# Patient Record
Sex: Female | Born: 1970 | Race: White | Hispanic: No | Marital: Married | Smoking: Never smoker
Health system: Southern US, Community
[De-identification: ages and names within clinical notes are randomized; demographics above are authoritative.]

---

## 2011-03-26 ENCOUNTER — Ambulatory Visit: Payer: Self-pay | Admitting: Nephrology

## 2012-04-04 LAB — HM PAP SMEAR

## 2012-06-30 ENCOUNTER — Ambulatory Visit: Payer: Self-pay

## 2012-06-30 LAB — HM MAMMOGRAPHY

## 2015-07-31 DIAGNOSIS — D509 Iron deficiency anemia, unspecified: Secondary | ICD-10-CM | POA: Insufficient documentation

## 2015-08-01 ENCOUNTER — Ambulatory Visit (INDEPENDENT_AMBULATORY_CARE_PROVIDER_SITE_OTHER): Payer: Managed Care, Other (non HMO) | Admitting: Unknown Physician Specialty

## 2015-08-01 ENCOUNTER — Encounter: Payer: Self-pay | Admitting: Unknown Physician Specialty

## 2015-08-01 VITALS — BP 110/82 | HR 76 | Temp 98.3°F | Ht 61.8 in | Wt 164.0 lb

## 2015-08-01 DIAGNOSIS — Z0000 Encounter for general adult medical examination without abnormal findings: Secondary | ICD-10-CM | POA: Diagnosis not present

## 2015-08-01 DIAGNOSIS — Z23 Encounter for immunization: Secondary | ICD-10-CM | POA: Diagnosis not present

## 2015-08-01 NOTE — Progress Notes (Signed)
+  BP 110/82 mmHg  Pulse 76  Temp(Src) 98.3 F (36.8 C)  Ht 5' 1.8" (1.57 m)  Wt 164 lb (74.39 kg)  BMI 30.18 kg/m2  SpO2 100%  LMP 07/22/2015 (Exact Date)   Subjective:    Patient ID: Mary Booker, female    DOB: 03/15/1971, 44 y.o.   MRN: [id number]  HPI: Mary Booker is a 44 y.o. female  Chief Complaint  Patient presents with  . Annual Exam    Relevant past medical, surgical, family and social history reviewed and updated as indicated. Interim medical history since our last visit reviewed. Allergies and medications reviewed and updated.  Depression screen PHQ 2/9 08/01/2015  Decreased Interest 0  Down, Depressed, Hopeless 0  PHQ - 2 Score 0     Review of Systems  Unable to perform ROS Constitutional: Negative.   HENT: Negative.   Eyes: Negative.   Respiratory: Negative.   Cardiovascular: Negative.   Gastrointestinal: Negative.   Endocrine: Negative.   Genitourinary: Negative.        Irregular menses, breast tenderness.    Musculoskeletal: Negative.   Skin: Negative.   Allergic/Immunologic: Negative.   Neurological: Negative.   Hematological: Negative.   Psychiatric/Behavioral: Negative.   All other systems reviewed and are negative.  Per HPI unless specifically indicated above     Objective:    BP 110/82 mmHg  Pulse 76  Temp(Src) 98.3 F (36.8 C)  Ht 5' 1.8" (1.57 m)  Wt 164 lb (74.39 kg)  BMI 30.18 kg/m2  SpO2 100%  LMP 07/22/2015 (Exact Date)  Wt Readings from Last 3 Encounters:  08/01/15 164 lb (74.39 kg)  02/21/15 161 lb (73.029 kg)    Physical Exam  Constitutional: She is oriented to person, place, and time. She appears well-developed and well-nourished.  HENT:  Head: Normocephalic and atraumatic.  Eyes: Pupils are equal, round, and reactive to light. Right eye exhibits no discharge. Left eye exhibits no discharge. No scleral icterus.  Neck: Normal range of motion. Neck supple. Carotid bruit is not present. No thyromegaly present.   Cardiovascular: Normal rate, regular rhythm and normal heart sounds.  Exam reveals no gallop and no friction rub.   No murmur heard. Pulmonary/Chest: Effort normal and breath sounds normal. No respiratory distress. She has no wheezes. She has no rales.  Abdominal: Soft. Bowel sounds are normal. There is no tenderness. There is no rebound.  Genitourinary: No breast swelling, tenderness or discharge.  Musculoskeletal: Normal range of motion.  Lymphadenopathy:    She has no cervical adenopathy.  Neurological: She is alert and oriented to person, place, and time.  Skin: Skin is warm, dry and intact. No rash noted.  Psychiatric: She has a normal mood and affect. Her speech is normal and behavior is normal. Judgment and thought content normal. Cognition and memory are normal.  Nursing note and vitals reviewed.     Assessment & Plan:   Problem List Items Addressed This Visit    None    Visit Diagnoses    Immunization due    -  Primary    Relevant Orders    Tdap vaccine greater than or equal to 7yo IM (Completed)    Annual physical exam        Relevant Orders    IGP, Aptima HPV, rfx 16/18,45    HIV antibody    CBC with Differential/Platelet    Comprehensive metabolic panel    Lipid Panel w/o Chol/HDL Ratio    TSH  Follow up plan: Return in about 1 year (around 07/31/2016).

## 2015-08-02 LAB — COMPREHENSIVE METABOLIC PANEL
A/G RATIO: 1.7 (ref 1.1–2.5)
ALK PHOS: 55 IU/L (ref 39–117)
ALT: 9 IU/L (ref 0–32)
AST: 10 IU/L (ref 0–40)
Albumin: 4.3 g/dL (ref 3.5–5.5)
BUN/Creatinine Ratio: 12 (ref 9–23)
BUN: 8 mg/dL (ref 6–24)
CHLORIDE: 101 mmol/L (ref 97–108)
CO2: 22 mmol/L (ref 18–29)
Calcium: 8.9 mg/dL (ref 8.7–10.2)
Creatinine, Ser: 0.69 mg/dL (ref 0.57–1.00)
GFR calc Af Amer: 122 mL/min/{1.73_m2} (ref >59)
GFR calc non Af Amer: 106 mL/min/{1.73_m2} (ref >59)
GLOBULIN, TOTAL: 2.5 g/dL (ref 1.5–4.5)
Glucose: 90 mg/dL (ref 65–99)
POTASSIUM: 4.6 mmol/L (ref 3.5–5.2)
SODIUM: 137 mmol/L (ref 134–144)
Total Protein: 6.8 g/dL (ref 6.0–8.5)

## 2015-08-02 LAB — CBC WITH DIFFERENTIAL/PLATELET
BASOS: 1 %
Basophils Absolute: 0 10*3/uL (ref 0.0–0.2)
EOS (ABSOLUTE): 0.2 10*3/uL (ref 0.0–0.4)
Eos: 3 %
HEMATOCRIT: 31 % — AB (ref 34.0–46.6)
Hemoglobin: 9.7 g/dL — ABNORMAL LOW (ref 11.1–15.9)
IMMATURE GRANULOCYTES: 1 %
Immature Grans (Abs): 0 10*3/uL (ref 0.0–0.1)
LYMPHS ABS: 1.4 10*3/uL (ref 0.7–3.1)
Lymphs: 28 %
MCH: 22.5 pg — AB (ref 26.6–33.0)
MCHC: 31.3 g/dL — AB (ref 31.5–35.7)
MCV: 72 fL — AB (ref 79–97)
MONOS ABS: 0.4 10*3/uL (ref 0.1–0.9)
Monocytes: 8 %
NEUTROS PCT: 59 %
Neutrophils Absolute: 3.1 10*3/uL (ref 1.4–7.0)
PLATELETS: 261 10*3/uL (ref 150–379)
RBC: 4.32 x10E6/uL (ref 3.77–5.28)
RDW: 16.2 % — AB (ref 12.3–15.4)
WBC: 5.1 10*3/uL (ref 3.4–10.8)

## 2015-08-02 LAB — LIPID PANEL W/O CHOL/HDL RATIO
CHOLESTEROL TOTAL: 175 mg/dL (ref 100–199)
HDL: 62 mg/dL (ref >39)
LDL Calculated: 101 mg/dL — ABNORMAL HIGH (ref 0–99)
TRIGLYCERIDES: 58 mg/dL (ref 0–149)
VLDL Cholesterol Cal: 12 mg/dL (ref 5–40)

## 2015-08-02 LAB — HIV ANTIBODY (ROUTINE TESTING W REFLEX): HIV Screen 4th Generation wRfx: NONREACTIVE

## 2015-08-02 LAB — TSH: TSH: 1.28 u[IU]/mL (ref 0.450–4.500)

## 2015-08-05 ENCOUNTER — Telehealth: Payer: Self-pay | Admitting: Unknown Physician Specialty

## 2015-08-05 MED ORDER — FERROUS SULFATE 325 (65 FE) MG PO TABS: 325.0000 mg | ORAL_TABLET | Freq: Every day | ORAL | Status: DC

## 2015-08-05 NOTE — Telephone Encounter (Signed)
Discuss labs with patient.  Pt states iron deficiency anemia is common for her.  Take FeSo4 BID.  Recheck in 1 month

## 2015-08-05 NOTE — Progress Notes (Signed)
Quick Note:  Patient notified of results by phone. ______ 

## 2015-08-05 NOTE — Telephone Encounter (Signed)
Routing to provider. Cheryl, did you call this patient about her labs? 

## 2015-08-05 NOTE — Telephone Encounter (Signed)
Pt was calling back about blood work  

## 2015-08-06 LAB — IGP, APTIMA HPV, RFX 16/18,45: PAP Smear Comment: 0

## 2015-08-06 NOTE — Telephone Encounter (Signed)
Pt scheduled for follow up on 09/05/15. Thanks.

## 2015-08-06 NOTE — Telephone Encounter (Signed)
Called and left patient a voicemail asking for her to please return my call to schedule office visit.

## 2015-08-06 NOTE — Telephone Encounter (Signed)
Opened in error

## 2015-08-29 ENCOUNTER — Other Ambulatory Visit: Payer: Managed Care, Other (non HMO)

## 2015-09-05 ENCOUNTER — Encounter: Payer: Self-pay | Admitting: Unknown Physician Specialty

## 2015-09-05 ENCOUNTER — Ambulatory Visit (INDEPENDENT_AMBULATORY_CARE_PROVIDER_SITE_OTHER): Payer: Managed Care, Other (non HMO) | Admitting: Unknown Physician Specialty

## 2015-09-05 VITALS — BP 113/80 | HR 83 | Temp 98.3°F | Ht 62.3 in | Wt 170.2 lb

## 2015-09-05 DIAGNOSIS — D509 Iron deficiency anemia, unspecified: Secondary | ICD-10-CM

## 2015-09-05 LAB — CBC WITH DIFFERENTIAL/PLATELET
Hematocrit: 33.7 % — ABNORMAL LOW (ref 34.0–46.6)
Hemoglobin: 11.4 g/dL (ref 11.1–15.9)
LYMPHS: 35 %
Lymphocytes Absolute: 1.3 10*3/uL (ref 0.7–3.1)
MCH: 27.2 pg (ref 26.6–33.0)
MCHC: 33.8 g/dL (ref 31.5–35.7)
MCV: 80 fL (ref 79–97)
MID (Absolute): 0.4 10*3/uL (ref 0.1–1.6)
MID: 11 %
NEUTROS ABS: 2.1 10*3/uL (ref 1.4–7.0)
Neutrophils: 54 %
PLATELETS: 202 10*3/uL (ref 150–379)
RBC: 4.19 x10E6/uL (ref 3.77–5.28)
RDW: 59.3 % — AB (ref 12.3–15.4)
WBC: 3.8 10*3/uL (ref 3.4–10.8)

## 2015-09-05 NOTE — Assessment & Plan Note (Signed)
H/H is now normal at 11.4/33.7.  Continue iron for 6 months.

## 2015-09-05 NOTE — Progress Notes (Signed)
BP 113/80 mmHg  Pulse 83  Temp(Src) 98.3 F (36.8 C)  Ht 5' 2.3" (1.582 m)  Wt 170 lb 3.2 oz (77.202 kg)  BMI 30.85 kg/m2  SpO2 98%  LMP 09/04/2015 (Exact Date)   Subjective:    Patient ID: Mary Booker, female    DOB: 1971/10/05, 44 y.o.   MRN: [id number]  HPI: Mary Booker is a 44 y.o. female  Chief Complaint  Patient presents with  . Anemia    pt states she was put on an Iron pill last visit and is here to follow-up on that   Anemia Taking an iron supplement and is doing well.  Energy level is good.    Relevant past medical, surgical, family and social history reviewed and updated as indicated. Interim medical history since our last visit reviewed. Allergies and medications reviewed and updated.  Review of Systems  Per HPI unless specifically indicated above     Objective:    BP 113/80 mmHg  Pulse 83  Temp(Src) 98.3 F (36.8 C)  Ht 5' 2.3" (1.582 m)  Wt 170 lb 3.2 oz (77.202 kg)  BMI 30.85 kg/m2  SpO2 98%  LMP 09/04/2015 (Exact Date)  Wt Readings from Last 3 Encounters:  09/05/15 170 lb 3.2 oz (77.202 kg)  08/01/15 164 lb (74.39 kg)  02/21/15 161 lb (73.029 kg)    Physical Exam  Constitutional: She is oriented to person, place, and time. She appears well-developed and well-nourished. No distress.  HENT:  Head: Normocephalic and atraumatic.  Eyes: Conjunctivae and lids are normal. Right eye exhibits no discharge. Left eye exhibits no discharge. No scleral icterus.  Cardiovascular: Normal rate, regular rhythm and normal heart sounds.   Pulmonary/Chest: Effort normal and breath sounds normal. No respiratory distress.  Abdominal: Normal appearance. There is no splenomegaly or hepatomegaly.  Musculoskeletal: Normal range of motion.  Neurological: She is alert and oriented to person, place, and time.  Skin: Skin is intact. No rash noted. No pallor.  Psychiatric: She has a normal mood and affect. Her behavior is normal. Judgment and thought content normal.   Nursing note and vitals reviewed.   Results for orders placed or performed in visit on 08/01/15  HIV antibody  Result Value Ref Range   HIV Screen 4th Generation wRfx Non Reactive Non Reactive  CBC with Differential/Platelet  Result Value Ref Range   WBC 5.1 3.4 - 10.8 x10E3/uL   RBC 4.32 3.77 - 5.28 x10E6/uL   Hemoglobin 9.7 (L) 11.1 - 15.9 g/dL   Hematocrit 40.9 (L) 81.1 - 46.6 %   MCV 72 (L) 79 - 97 fL   MCH 22.5 (L) 26.6 - 33.0 pg   MCHC 31.3 (L) 31.5 - 35.7 g/dL   RDW 91.4 (H) 78.2 - 95.6 %   Platelets 261 150 - 379 x10E3/uL   Neutrophils 59 %   Lymphs 28 %   Monocytes 8 %   Eos 3 %   Basos 1 %   Neutrophils Absolute 3.1 1.4 - 7.0 x10E3/uL   Lymphocytes Absolute 1.4 0.7 - 3.1 x10E3/uL   Monocytes Absolute 0.4 0.1 - 0.9 x10E3/uL   EOS (ABSOLUTE) 0.2 0.0 - 0.4 x10E3/uL   Basophils Absolute 0.0 0.0 - 0.2 x10E3/uL   Immature Granulocytes 1 %   Immature Grans (Abs) 0.0 0.0 - 0.1 x10E3/uL  Comprehensive metabolic panel  Result Value Ref Range   Glucose 90 65 - 99 mg/dL   BUN 8 6 - 24 mg/dL   Creatinine, Ser 2.13 0.57 -  1.00 mg/dL   GFR calc non Af Amer 106 >59 mL/min/1.73   GFR calc Af Amer 122 >59 mL/min/1.73   BUN/Creatinine Ratio 12 9 - 23   Sodium 137 134 - 144 mmol/L   Potassium 4.6 3.5 - 5.2 mmol/L   Chloride 101 97 - 108 mmol/L   CO2 22 18 - 29 mmol/L   Calcium 8.9 8.7 - 10.2 mg/dL   Total Protein 6.8 6.0 - 8.5 g/dL   Albumin 4.3 3.5 - 5.5 g/dL   Globulin, Total 2.5 1.5 - 4.5 g/dL   Albumin/Globulin Ratio 1.7 1.1 - 2.5   Bilirubin Total <0.2 0.0 - 1.2 mg/dL   Alkaline Phosphatase 55 39 - 117 IU/L   AST 10 0 - 40 IU/L   ALT 9 0 - 32 IU/L  Lipid Panel w/o Chol/HDL Ratio  Result Value Ref Range   Cholesterol, Total 175 100 - 199 mg/dL   Triglycerides 58 0 - 149 mg/dL   HDL 62 >16>39 mg/dL   VLDL Cholesterol Cal 12 5 - 40 mg/dL   LDL Calculated 109101 (H) 0 - 99 mg/dL  TSH  Result Value Ref Range   TSH 1.280 0.450 - 4.500 uIU/mL  IGP, Aptima HPV, rfx  16/18,45  Result Value Ref Range   DIAGNOSIS: Comment    Specimen adequacy: Comment    CLINICIAN PROVIDED ICD10: Comment    Performed by: Comment    PAP SMEAR COMMENT .    Note: Comment    Test Methodology Comment       Assessment & Plan:   Problem List Items Addressed This Visit      Unprioritized   Anemia, iron deficiency - Primary    H/H is now normal at 11.4/33.7.  Continue iron for 6 months.        Relevant Orders   CBC With Differential/Platelet   Vitamin B12   Folate   Iron and TIBC   Ferritin       Follow up plan: Return for physical in 9 moths.

## 2015-09-06 LAB — IRON AND TIBC
Iron Saturation: 24 % (ref 15–55)
Iron: 70 ug/dL (ref 27–159)
TIBC: 294 ug/dL (ref 250–450)
UIBC: 224 ug/dL (ref 131–425)

## 2015-09-06 LAB — VITAMIN B12: Vitamin B-12: 490 pg/mL (ref 211–946)

## 2015-09-06 LAB — FOLATE: Folate: 15.7 ng/mL (ref >3.0)

## 2015-09-06 LAB — FERRITIN: Ferritin: 18 ng/mL (ref 15–150)

## 2016-08-13 ENCOUNTER — Ambulatory Visit (INDEPENDENT_AMBULATORY_CARE_PROVIDER_SITE_OTHER): Payer: Managed Care, Other (non HMO) | Admitting: Unknown Physician Specialty

## 2016-08-13 ENCOUNTER — Encounter: Payer: Self-pay | Admitting: Unknown Physician Specialty

## 2016-08-13 VITALS — BP 104/70 | HR 71 | Temp 98.2°F | Ht 62.2 in | Wt 161.2 lb

## 2016-08-13 DIAGNOSIS — Z0000 Encounter for general adult medical examination without abnormal findings: Secondary | ICD-10-CM

## 2016-08-13 NOTE — Patient Instructions (Signed)
Please do call to schedule your mammogram; the number to schedule one at either [facility] or [facility] is [phone]   

## 2016-08-13 NOTE — Progress Notes (Signed)
BP 104/70 (BP Location: Left Arm, Patient Position: Sitting, Cuff Size: Normal)   Pulse 71   Temp 98.2 F (36.8 C)   Ht 5' 2.2" (1.58 m)   Wt 161 lb 3.2 oz (73.1 kg)   LMP 08/06/2016 (Exact Date)   SpO2 100%   BMI 29.29 kg/m    Subjective:    Patient ID: Mary Booker, female    DOB: 03/10/1971, 45 y.o.   MRN: [id number]  HPI: Mary Booker is a 45 y.o. female  Chief Complaint  Patient presents with  . Annual Exam   Social History   Social History  . Marital status: Married    Spouse name: N/A  . Number of children: N/A  . Years of education: N/A   Occupational History  . Not on file.   Social History Main Topics  . Smoking status: Never Smoker  . Smokeless tobacco: Never Used  . Alcohol use No  . Drug use: No  . Sexual activity: Yes   Other Topics Concern  . Not on file   Social History Narrative  . No narrative on file   Family History  Problem Relation Age of Onset  . Thyroid disease Mother   . Diabetes Father   . Hypertension Father   . Asthma Father   . Alzheimer's disease Maternal Grandmother   . Heart disease Paternal Grandmother   . Alzheimer's disease Paternal Grandmother   . Cancer Paternal Grandfather     lung   History reviewed. No pertinent past medical history. Past Surgical History:  Procedure Laterality Date  . CESAREAN SECTION     +----------- Relevant past medical, surgical, family and social history reviewed and updated as indicated. Interim medical history since our last visit reviewed. Allergies and medications reviewed and updated.  Review of Systems  Per HPI unless specifically indicated above     Objective:    BP 104/70 (BP Location: Left Arm, Patient Position: Sitting, Cuff Size: Normal)   Pulse 71   Temp 98.2 F (36.8 C)   Ht 5' 2.2" (1.58 m)   Wt 161 lb 3.2 oz (73.1 kg)   LMP 08/06/2016 (Exact Date)   SpO2 100%   BMI 29.29 kg/m   Wt Readings from Last 3 Encounters:  08/13/16 161 lb 3.2 oz (73.1 kg)    09/05/15 170 lb 3.2 oz (77.2 kg)  08/01/15 164 lb (74.4 kg)    Physical Exam  Constitutional: She is oriented to person, place, and time. She appears well-developed and well-nourished.  HENT:  Head: Normocephalic and atraumatic.  Eyes: Pupils are equal, round, and reactive to light. Right eye exhibits no discharge. Left eye exhibits no discharge. No scleral icterus.  Neck: Normal range of motion. Neck supple. Carotid bruit is not present. No thyromegaly present.  Cardiovascular: Normal rate, regular rhythm and normal heart sounds.  Exam reveals no gallop and no friction rub.   No murmur heard. Pulmonary/Chest: Effort normal and breath sounds normal. No respiratory distress. She has no wheezes. She has no rales.  Abdominal: Soft. Bowel sounds are normal. There is no tenderness. There is no rebound.  Genitourinary: No breast swelling, tenderness or discharge.  Musculoskeletal: Normal range of motion.  Lymphadenopathy:    She has no cervical adenopathy.  Neurological: She is alert and oriented to person, place, and time.  Skin: Skin is warm, dry and intact. No rash noted.  Psychiatric: She has a normal mood and affect. Her speech is normal and behavior is normal. Judgment and  thought content normal. Cognition and memory are normal.    Results for orders placed or performed in visit on 09/05/15  CBC With Differential/Platelet  Result Value Ref Range   WBC 3.8 3.4 - 10.8 x10E3/uL   RBC 4.19 3.77 - 5.28 x10E6/uL   Hemoglobin 11.4 11.1 - 15.9 g/dL   Hematocrit 16.1 (L) 09.6 - 46.6 %   MCV 80 79 - 97 fL   MCH 27.2 26.6 - 33.0 pg   MCHC 33.8 31.5 - 35.7 g/dL   RDW 04.5 (H) 40.9 - 81.1 %   Platelets 202 150 - 379 x10E3/uL   Neutrophils 54 %   Lymphs 35 %   MID 11 %   Neutrophils Absolute 2.1 1.4 - 7.0 x10E3/uL   Lymphocytes Absolute 1.3 0.7 - 3.1 x10E3/uL   MID (Absolute) 0.4 0.1 - 1.6 X10E3/uL  Vitamin B12  Result Value Ref Range   Vitamin B-12 490 211 - 946 pg/mL  Folate  Result  Value Ref Range   Folate 15.7 >3.0 ng/mL  Iron and TIBC  Result Value Ref Range   Total Iron Binding Capacity 294 250 - 450 ug/dL   UIBC 914 782 - 956 ug/dL   Iron 70 27 - 213 ug/dL   Iron Saturation 24 15 - 55 %  Ferritin  Result Value Ref Range   Ferritin 18 15 - 150 ng/mL      Assessment & Plan:   Problem List Items Addressed This Visit    None    Visit Diagnoses    Annual physical exam    -  Primary   Relevant Orders   CBC with Differential/Platelet   Comprehensive metabolic panel   Lipid Panel w/o Chol/HDL Ratio   MM DIGITAL SCREENING BILATERAL   TSH       Follow up plan: Return in about 1 year (around 08/13/2017).

## 2016-08-14 LAB — LIPID PANEL W/O CHOL/HDL RATIO
Cholesterol, Total: 153 mg/dL (ref 100–199)
HDL: 62 mg/dL (ref >39)
LDL Calculated: 83 mg/dL (ref 0–99)
Triglycerides: 39 mg/dL (ref 0–149)
VLDL Cholesterol Cal: 8 mg/dL (ref 5–40)

## 2016-08-14 LAB — COMPREHENSIVE METABOLIC PANEL
A/G RATIO: 2 (ref 1.2–2.2)
ALBUMIN: 4.2 g/dL (ref 3.5–5.5)
ALT: 8 IU/L (ref 0–32)
AST: 12 IU/L (ref 0–40)
Alkaline Phosphatase: 48 IU/L (ref 39–117)
BUN / CREAT RATIO: 14 (ref 9–23)
BUN: 10 mg/dL (ref 6–24)
Bilirubin Total: 0.2 mg/dL (ref 0.0–1.2)
CHLORIDE: 100 mmol/L (ref 96–106)
CO2: 27 mmol/L (ref 18–29)
Calcium: 8.8 mg/dL (ref 8.7–10.2)
Creatinine, Ser: 0.74 mg/dL (ref 0.57–1.00)
GFR calc non Af Amer: 98 mL/min/{1.73_m2} (ref >59)
GFR, EST AFRICAN AMERICAN: 113 mL/min/{1.73_m2} (ref >59)
Globulin, Total: 2.1 g/dL (ref 1.5–4.5)
Glucose: 91 mg/dL (ref 65–99)
POTASSIUM: 4.5 mmol/L (ref 3.5–5.2)
SODIUM: 138 mmol/L (ref 134–144)
TOTAL PROTEIN: 6.3 g/dL (ref 6.0–8.5)

## 2016-08-14 LAB — CBC WITH DIFFERENTIAL/PLATELET
BASOS: 1 %
Basophils Absolute: 0 10*3/uL (ref 0.0–0.2)
EOS (ABSOLUTE): 0.2 10*3/uL (ref 0.0–0.4)
Eos: 4 %
Hematocrit: 34.4 % (ref 34.0–46.6)
Hemoglobin: 11.4 g/dL (ref 11.1–15.9)
IMMATURE GRANS (ABS): 0 10*3/uL (ref 0.0–0.1)
Immature Granulocytes: 0 %
LYMPHS: 34 %
Lymphocytes Absolute: 1.4 10*3/uL (ref 0.7–3.1)
MCH: 28 pg (ref 26.6–33.0)
MCHC: 33.1 g/dL (ref 31.5–35.7)
MCV: 85 fL (ref 79–97)
Monocytes Absolute: 0.3 10*3/uL (ref 0.1–0.9)
Monocytes: 8 %
NEUTROS ABS: 2.1 10*3/uL (ref 1.4–7.0)
Neutrophils: 53 %
PLATELETS: 232 10*3/uL (ref 150–379)
RBC: 4.07 x10E6/uL (ref 3.77–5.28)
RDW: 14.5 % (ref 12.3–15.4)
WBC: 4 10*3/uL (ref 3.4–10.8)

## 2016-08-14 LAB — TSH: TSH: 1.42 u[IU]/mL (ref 0.450–4.500)

## 2016-08-16 ENCOUNTER — Encounter: Payer: Self-pay | Admitting: Unknown Physician Specialty

## 2016-09-17 ENCOUNTER — Ambulatory Visit
Admission: RE | Admit: 2016-09-17 | Discharge: 2016-09-17 | Disposition: A | Discharge status: Home / Self Care | Payer: Managed Care, Other (non HMO) | Source: Ambulatory Visit | Attending: Unknown Physician Specialty | Admitting: Unknown Physician Specialty

## 2016-09-17 DIAGNOSIS — Z0000 Encounter for general adult medical examination without abnormal findings: Secondary | ICD-10-CM

## 2016-09-17 DIAGNOSIS — Z1231 Encounter for screening mammogram for malignant neoplasm of breast: Secondary | ICD-10-CM | POA: Insufficient documentation

## 2016-09-22 ENCOUNTER — Telehealth: Payer: Managed Care, Other (non HMO) | Admitting: Nurse Practitioner

## 2016-09-22 DIAGNOSIS — N3000 Acute cystitis without hematuria: Secondary | ICD-10-CM

## 2016-09-22 MED ORDER — NITROFURANTOIN MONOHYD MACRO 100 MG PO CAPS: 100.0000 mg | ORAL_CAPSULE | Freq: Two times a day (BID) | ORAL | 0 refills | Status: DC

## 2016-09-22 NOTE — Progress Notes (Signed)

## 2016-11-26 ENCOUNTER — Ambulatory Visit (INDEPENDENT_AMBULATORY_CARE_PROVIDER_SITE_OTHER): Payer: Managed Care, Other (non HMO) | Admitting: Unknown Physician Specialty

## 2016-11-26 ENCOUNTER — Encounter: Payer: Self-pay | Admitting: Unknown Physician Specialty

## 2016-11-26 DIAGNOSIS — N951 Menopausal and female climacteric states: Secondary | ICD-10-CM | POA: Diagnosis not present

## 2016-11-26 DIAGNOSIS — R454 Irritability and anger: Secondary | ICD-10-CM | POA: Diagnosis not present

## 2016-11-26 MED ORDER — SERTRALINE HCL 50 MG PO TABS: 50.0000 mg | ORAL_TABLET | Freq: Every day | ORAL | 3 refills | Status: DC

## 2016-11-26 NOTE — Assessment & Plan Note (Signed)
Start Zoloft 50 mg.  Start Vitamin D 2,000 IUs/day

## 2016-11-26 NOTE — Patient Instructions (Addendum)
Take Vitamin D3 2000 IUs/day  Take Magnesium.  I recommend Glycinate, Malate, or Citrate.  Not Oxide in the evening.

## 2016-11-26 NOTE — Assessment & Plan Note (Signed)
Start Zoloft

## 2016-11-26 NOTE — Progress Notes (Signed)
BP 113/78 (BP Location: Left Arm, Patient Position: Sitting, Cuff Size: Large)   Pulse 83   Temp 97.9 F (36.6 C)   Wt 167 lb 3.2 oz (75.8 kg)   LMP 11/11/2016 (Approximate)   SpO2 100%   BMI 30.38 kg/m    Subjective:    Patient ID: Mary Booker, female    DOB: 07/19/1971, 46 y.o.   MRN: [id number]  HPI: Mary Booker is a 46 y.o. female  Chief Complaint  Patient presents with  . Menopause    pt states she wants to talk with provider about peri menopause. States she has been bothered with it for months.    Peri-menoause Pt states she frequently misses periods and finds they are irregular and not heavy.  No hot flashes.  She finds she is irritable much of the time but not really depressed.  She did start weight watchers but not exercising.   Depression screen [facility] 2/9 11/26/2016 08/13/2016 08/01/2015  Decreased Interest 1 0 0  Down, Depressed, Hopeless 1 0 0  PHQ - 2 Score 2 0 0  Altered sleeping - 1 -  Tired, decreased energy - 1 -  Change in appetite - 0 -  Feeling bad or failure about yourself  - 0 -  Trouble concentrating - 0 -  Moving slowly or fidgety/restless - 0 -  Suicidal thoughts - 0 -  PHQ-9 Score - 2 -     Relevant past medical, surgical, family and social history reviewed and updated as indicated. Interim medical history since our last visit reviewed. Allergies and medications reviewed and updated.  Review of Systems  Per HPI unless specifically indicated above     Objective:    BP 113/78 (BP Location: Left Arm, Patient Position: Sitting, Cuff Size: Large)   Pulse 83   Temp 97.9 F (36.6 C)   Wt 167 lb 3.2 oz (75.8 kg)   LMP 11/11/2016 (Approximate)   SpO2 100%   BMI 30.38 kg/m   Wt Readings from Last 3 Encounters:  11/26/16 167 lb 3.2 oz (75.8 kg)  08/13/16 161 lb 3.2 oz (73.1 kg)  09/05/15 170 lb 3.2 oz (77.2 kg)    Physical Exam  Constitutional: She is oriented to person, place, and time. She appears well-developed and well-nourished. No  distress.  HENT:  Head: Normocephalic and atraumatic.  Eyes: Conjunctivae and lids are normal. Right eye exhibits no discharge. Left eye exhibits no discharge. No scleral icterus.  Cardiovascular: Normal rate.   Pulmonary/Chest: Effort normal.  Abdominal: Normal appearance. There is no splenomegaly or hepatomegaly.  Musculoskeletal: Normal range of motion.  Neurological: She is alert and oriented to person, place, and time.  Skin: Skin is intact. No rash noted. No pallor.  Psychiatric: She has a normal mood and affect. Her behavior is normal. Judgment and thought content normal.    Results for orders placed or performed in visit on 08/13/16  CBC with Differential/Platelet  Result Value Ref Range   WBC 4.0 3.4 - 10.8 x10E3/uL   RBC 4.07 3.77 - 5.28 x10E6/uL   Hemoglobin 11.4 11.1 - 15.9 g/dL   Hematocrit 62.9 52.8 - 46.6 %   MCV 85 79 - 97 fL   MCH 28.0 26.6 - 33.0 pg   MCHC 33.1 31.5 - 35.7 g/dL   RDW 41.3 24.4 - 01.0 %   Platelets 232 150 - 379 x10E3/uL   Neutrophils 53 Not Estab. %   Lymphs 34 Not Estab. %   Monocytes 8 Not  Estab. %   Eos 4 Not Estab. %   Basos 1 Not Estab. %   Neutrophils Absolute 2.1 1.4 - 7.0 x10E3/uL   Lymphocytes Absolute 1.4 0.7 - 3.1 x10E3/uL   Monocytes Absolute 0.3 0.1 - 0.9 x10E3/uL   EOS (ABSOLUTE) 0.2 0.0 - 0.4 x10E3/uL   Basophils Absolute 0.0 0.0 - 0.2 x10E3/uL   Immature Granulocytes 0 Not Estab. %   Immature Grans (Abs) 0.0 0.0 - 0.1 x10E3/uL  Comprehensive metabolic panel  Result Value Ref Range   Glucose 91 65 - 99 mg/dL   BUN 10 6 - 24 mg/dL   Creatinine, Ser 1.610.74 0.57 - 1.00 mg/dL   GFR calc non Af Amer 98 >59 mL/min/1.73   GFR calc Af Amer 113 >59 mL/min/1.73   BUN/Creatinine Ratio 14 9 - 23   Sodium 138 134 - 144 mmol/L   Potassium 4.5 3.5 - 5.2 mmol/L   Chloride 100 96 - 106 mmol/L   CO2 27 18 - 29 mmol/L   Calcium 8.8 8.7 - 10.2 mg/dL   Total Protein 6.3 6.0 - 8.5 g/dL   Albumin 4.2 3.5 - 5.5 g/dL   Globulin, Total 2.1 1.5  - 4.5 g/dL   Albumin/Globulin Ratio 2.0 1.2 - 2.2   Bilirubin Total 0.2 0.0 - 1.2 mg/dL   Alkaline Phosphatase 48 39 - 117 IU/L   AST 12 0 - 40 IU/L   ALT 8 0 - 32 IU/L  Lipid Panel w/o Chol/HDL Ratio  Result Value Ref Range   Cholesterol, Total 153 100 - 199 mg/dL   Triglycerides 39 0 - 149 mg/dL   HDL 62 >09>39 mg/dL   VLDL Cholesterol Cal 8 5 - 40 mg/dL   LDL Calculated 83 0 - 99 mg/dL  TSH  Result Value Ref Range   TSH 1.420 0.450 - 4.500 uIU/mL      Assessment & Plan:   Problem List Items Addressed This Visit      Unprioritized   Irritable    Start Zoloft      Peri-menopause    Start Zoloft 50 mg.  Start Vitamin D 2,000 IUs/day           Follow up plan: Return in about 4 weeks (around 12/24/2016).

## 2016-12-24 ENCOUNTER — Encounter: Payer: Self-pay | Admitting: Unknown Physician Specialty

## 2016-12-24 ENCOUNTER — Ambulatory Visit (INDEPENDENT_AMBULATORY_CARE_PROVIDER_SITE_OTHER): Payer: Managed Care, Other (non HMO) | Admitting: Unknown Physician Specialty

## 2016-12-24 DIAGNOSIS — N951 Menopausal and female climacteric states: Secondary | ICD-10-CM | POA: Diagnosis not present

## 2016-12-24 DIAGNOSIS — R454 Irritability and anger: Secondary | ICD-10-CM | POA: Diagnosis not present

## 2016-12-24 MED ORDER — SERTRALINE HCL 50 MG PO TABS: 50.0000 mg | ORAL_TABLET | Freq: Every day | ORAL | 3 refills | Status: DC

## 2016-12-24 NOTE — Assessment & Plan Note (Signed)
Continue Zoloft 50 mg

## 2016-12-24 NOTE — Assessment & Plan Note (Addendum)
Continue Zoloft 50 mg. Patient advised to take Vitamin D 2000 IU in the morning rather than at night for maximum benefit. Continue Mg for sleep aid.

## 2016-12-24 NOTE — Progress Notes (Signed)
   BP 107/74 (BP Location: Left Arm, Patient Position: Sitting, Cuff Size: Normal)   Pulse 80   Temp 98.1 F (36.7 C)   Wt 161 lb 12.8 oz (73.4 kg)   SpO2 100%   BMI 29.40 kg/m    Subjective:    Patient ID: Mary Booker, female    DOB: 12/13/1970, 46 y.o.   MRN: [id number]  HPI: Mary Booker is a 46 y.o. female  Chief Complaint  Patient presents with  . Follow-up    1 month f/up after starting sertraline   Irritability Patient began Zoloft 50 mg and Vitamin D 2,000 IU on 11/26/2016 due to irritability and is returning for a follow up. She states irritability has improved greatly and she feels irritable less often. Denies dizziness, insomnia, or GI side effects. Takes Vitamin D and Magnesium at night and has been sleeping well.   Peri-menopause Patient has a history of missed and irregular periods. She denies hot flashes, mood changes, sleep disturbances, vaginal dryness, joint pain, memory loss. LMP 2 weeks ago with light bleeding and lasted 2 days and is patient's baseline. Age of menarche is [age], mother's age of menopause is unknown. Does not currently have a gynecologist.  Relevant past medical, surgical, family and social history reviewed and updated as indicated. Interim medical history since our last visit reviewed. Allergies and medications reviewed and updated.  Review of Systems  Per HPI unless specifically indicated above     Objective:    BP 107/74 (BP Location: Left Arm, Patient Position: Sitting, Cuff Size: Normal)   Pulse 80   Temp 98.1 F (36.7 C)   Wt 161 lb 12.8 oz (73.4 kg)   SpO2 100%   BMI 29.40 kg/m   Wt Readings from Last 3 Encounters:  12/24/16 161 lb 12.8 oz (73.4 kg)  11/26/16 167 lb 3.2 oz (75.8 kg)  08/13/16 161 lb 3.2 oz (73.1 kg)    Physical Exam  Constitutional: She is oriented to person, place, and time. She appears well-developed and well-nourished. No distress.  HENT:  Head: Normocephalic and atraumatic.  Eyes: Conjunctivae are  normal. Right eye exhibits no discharge. Left eye exhibits no discharge. No scleral icterus.  Neck: Normal range of motion. Neck supple. No JVD present.  Cardiovascular: Normal rate, regular rhythm, normal heart sounds and intact distal pulses.   Pulmonary/Chest: Effort normal and breath sounds normal. No respiratory distress. She has no wheezes. She has no rales.  Abdominal: Soft. Bowel sounds are normal.  Musculoskeletal: Normal range of motion.  Neurological: She is alert and oriented to person, place, and time.  Skin: Skin is warm and dry.  Psychiatric: She has a normal mood and affect. Her behavior is normal. Judgment and thought content normal.       Assessment & Plan:   Problem List Items Addressed This Visit    Peri-menopause    Continue Zoloft 50 mg.       Irritable    Continue Zoloft 50 mg. Patient advised to take Vitamin D 2000 IU in the morning rather than at night for maximum benefit. Continue Mg for sleep aid.           Follow up plan: Return in about 8 months (around 08/13/2017) for Annual physical.

## 2017-08-19 ENCOUNTER — Encounter: Payer: Managed Care, Other (non HMO) | Admitting: Unknown Physician Specialty

## 2017-08-26 ENCOUNTER — Ambulatory Visit (INDEPENDENT_AMBULATORY_CARE_PROVIDER_SITE_OTHER): Payer: Managed Care, Other (non HMO) | Admitting: Unknown Physician Specialty

## 2017-08-26 ENCOUNTER — Encounter: Payer: Self-pay | Admitting: Unknown Physician Specialty

## 2017-08-26 VITALS — BP 106/67 | HR 87 | Temp 97.9°F | Ht 62.2 in | Wt 148.3 lb

## 2017-08-26 DIAGNOSIS — G43829 Menstrual migraine, not intractable, without status migrainosus: Secondary | ICD-10-CM

## 2017-08-26 DIAGNOSIS — R454 Irritability and anger: Secondary | ICD-10-CM | POA: Diagnosis not present

## 2017-08-26 DIAGNOSIS — E559 Vitamin D deficiency, unspecified: Secondary | ICD-10-CM

## 2017-08-26 DIAGNOSIS — Z0000 Encounter for general adult medical examination without abnormal findings: Secondary | ICD-10-CM

## 2017-08-26 DIAGNOSIS — R5383 Other fatigue: Secondary | ICD-10-CM

## 2017-08-26 MED ORDER — FROVATRIPTAN SUCCINATE 2.5 MG PO TABS: 2.5000 mg | ORAL_TABLET | ORAL | 0 refills | Status: DC | PRN

## 2017-08-26 MED ORDER — BUSPIRONE HCL 5 MG PO TABS: 5.0000 mg | ORAL_TABLET | Freq: Three times a day (TID) | ORAL | 0 refills | Status: DC | PRN

## 2017-08-26 NOTE — Assessment & Plan Note (Signed)
OK for Triptan for relief of migraines when she gets them.

## 2017-08-26 NOTE — Patient Instructions (Signed)
Psychology today>Find a therapist

## 2017-08-26 NOTE — Progress Notes (Addendum)
BP 106/67   Pulse 87   Temp 97.9 F (36.6 C)   Ht 5' 2.2" (1.58 m)   Wt 148 lb 4.8 oz (67.3 kg)   LMP 08/12/2017 (Exact Date)   SpO2 99%   BMI 26.95 kg/m    Subjective:    Patient ID: Mary Booker, female    DOB: 05/23/1971, 46 y.o.   MRN: [id number]  HPI: Mary Booker is a 46 y.o. female  Chief Complaint  Patient presents with  . Annual Exam   Anxiety Doesn't think anti-anxiety medications is working very well.  Got written up for being angry and not a team player.  States she sleeps well at night.  [facility] 09/23/2017 08/26/2017 12/24/2016 11/26/2016 08/13/2016  Decreased Interest 0 0 1 1 0  Down, Depressed, Hopeless 1 0 0 1 0  PHQ - 2 Score 1 0 1 2 0  Altered sleeping 0 - - - 1  Tired, decreased energy 0 - - - 1  Change in appetite 1 - - - 0  Feeling bad or failure about yourself  0 - - - 0  Trouble concentrating - - - - 0  Moving slowly or fidgety/restless 0 - - - 0  Suicidal thoughts 0 - - - 0  PHQ-9 Score 2 - - - 2   Fatigue Describes low energy.  Had a vit D deficiency at one time and would like to get it rechecked  Migraines Getting migraines intermittently with menstrual periods.  Lasts for 2 days and then gone.  Wants a medication to take that won't make her sleepy.  Usually behind right eye and to the back of her head.  Menses are monthly but not regular.  Makes Magnesium Citrate at bed time. No aura.  Wakes up with them.   Drinks 2-3 cups   Social History   Socioeconomic History  . Marital status: Married    Spouse name: Not on file  . Number of children: Not on file  . Years of education: Not on file  . Highest education level: Not on file  Social Needs  . Financial resource strain: Not on file  . Food insecurity - worry: Not on file  . Food insecurity - inability: Not on file  . Transportation needs - medical: Not on file  . Transportation needs - non-medical: Not on file  Occupational History  . Not on file  Tobacco Use  .  Smoking status: Never Smoker  . Smokeless tobacco: Never Used  Substance and Sexual Activity  . Alcohol use: No    Alcohol/week: 0.0 oz  . Drug use: No  . Sexual activity: Yes  Other Topics Concern  . Not on file  Social History Narrative  . Not on file   Family History  Problem Relation Age of Onset  . Thyroid disease Mother   . Diabetes Father   . Hypertension Father   . Asthma Father   . Alzheimer's disease Maternal Grandmother   . Heart disease Paternal Grandmother   . Alzheimer's disease Paternal Grandmother   . Cancer Paternal Grandfather        lung  . Breast cancer Maternal Aunt    History reviewed. No pertinent past medical history.  Past Surgical History:  Procedure Laterality Date  . CESAREAN SECTION     Relevant past medical, surgical, family and social history reviewed and updated as indicated. Interim medical history since our last visit reviewed. Allergies and medications reviewed and updated.  Review of Systems  Per HPI unless specifically indicated above     Objective:    BP 106/67   Pulse 87   Temp 97.9 F (36.6 C)   Ht 5' 2.2" (1.58 m)   Wt 148 lb 4.8 oz (67.3 kg)   LMP 08/12/2017 (Exact Date)   SpO2 99%   BMI 26.95 kg/m   Wt Readings from Last 3 Encounters:  08/26/17 148 lb 4.8 oz (67.3 kg)  12/24/16 161 lb 12.8 oz (73.4 kg)  11/26/16 167 lb 3.2 oz (75.8 kg)    Physical Exam  Constitutional: She is oriented to person, place, and time. She appears well-developed and well-nourished.  HENT:  Head: Normocephalic and atraumatic.  Eyes: Pupils are equal, round, and reactive to light. Right eye exhibits no discharge. Left eye exhibits no discharge. No scleral icterus.  Neck: Normal range of motion. Neck supple. Carotid bruit is not present. No thyromegaly present.  Cardiovascular: Normal rate, regular rhythm and normal heart sounds.  Exam reveals no gallop and no friction rub.   No murmur heard. Pulmonary/Chest: Effort normal and breath  sounds normal. No respiratory distress. She has no wheezes. She has no rales.  Abdominal: Soft. Bowel sounds are normal. There is no tenderness. There is no rebound.  Genitourinary: No breast swelling, tenderness or discharge.  Musculoskeletal: Normal range of motion.  Lymphadenopathy:    She has no cervical adenopathy.  Neurological: She is alert and oriented to person, place, and time.  Skin: Skin is warm, dry and intact. No rash noted.  Psychiatric: She has a normal mood and affect. Her speech is normal and behavior is normal. Judgment and thought content normal. Cognition and memory are normal.   +  Assessment & Plan:   Problem List Items Addressed This Visit      Unprioritized   Fatigue    Check labs      Irritable    This is becoming a problem at work.  Recommended counseling.  Start Bupirone 5 mg tid prn      Menstrual migraine    OK for Triptan for relief of migraines when she gets them.        Relevant Medications   frovatriptan (FROVA) 2.5 MG tablet   Vitamin D deficiency    History of.  Recheck Vit D       Other Visit Diagnoses    Annual physical exam    -  Primary   Relevant Orders   CBC with Differential/Platelet (Completed)   Comprehensive metabolic panel (Completed)   Lipid Panel w/o Chol/HDL Ratio (Completed)   TSH (Completed)   VITAMIN D 25 Hydroxy (Vit-D Deficiency, Fractures) (Completed)       Follow up plan: Return in about 4 weeks (around 09/23/2017).

## 2017-08-26 NOTE — Assessment & Plan Note (Addendum)
This is becoming a problem at work.  Recommended counseling.  Start Bupirone 5 mg tid prn

## 2017-08-27 LAB — COMPREHENSIVE METABOLIC PANEL
A/G RATIO: 2 (ref 1.2–2.2)
ALBUMIN: 4.3 g/dL (ref 3.5–5.5)
ALT: 7 IU/L (ref 0–32)
AST: 15 IU/L (ref 0–40)
Alkaline Phosphatase: 43 IU/L (ref 39–117)
BUN/Creatinine Ratio: 13 (ref 9–23)
BUN: 10 mg/dL (ref 6–24)
Bilirubin Total: <0.2 mg/dL (ref 0.0–1.2)
CALCIUM: 9 mg/dL (ref 8.7–10.2)
CO2: 24 mmol/L (ref 20–29)
Chloride: 103 mmol/L (ref 96–106)
Creatinine, Ser: 0.78 mg/dL (ref 0.57–1.00)
GFR, EST AFRICAN AMERICAN: 105 mL/min/{1.73_m2} (ref >59)
GFR, EST NON AFRICAN AMERICAN: 91 mL/min/{1.73_m2} (ref >59)
Globulin, Total: 2.2 g/dL (ref 1.5–4.5)
Glucose: 91 mg/dL (ref 65–99)
POTASSIUM: 4.1 mmol/L (ref 3.5–5.2)
Sodium: 140 mmol/L (ref 134–144)
TOTAL PROTEIN: 6.5 g/dL (ref 6.0–8.5)

## 2017-08-27 LAB — CBC WITH DIFFERENTIAL/PLATELET
BASOS: 1 %
Basophils Absolute: 0 10*3/uL (ref 0.0–0.2)
EOS (ABSOLUTE): 0.2 10*3/uL (ref 0.0–0.4)
EOS: 4 %
HEMATOCRIT: 36.9 % (ref 34.0–46.6)
Hemoglobin: 11.8 g/dL (ref 11.1–15.9)
IMMATURE GRANULOCYTES: 0 %
Immature Grans (Abs): 0 10*3/uL (ref 0.0–0.1)
LYMPHS ABS: 1.2 10*3/uL (ref 0.7–3.1)
Lymphs: 31 %
MCH: 29.7 pg (ref 26.6–33.0)
MCHC: 32 g/dL (ref 31.5–35.7)
MCV: 93 fL (ref 79–97)
MONOS ABS: 0.1 10*3/uL (ref 0.1–0.9)
Monocytes: 4 %
NEUTROS PCT: 60 %
Neutrophils Absolute: 2.3 10*3/uL (ref 1.4–7.0)
PLATELETS: 212 10*3/uL (ref 150–379)
RBC: 3.97 x10E6/uL (ref 3.77–5.28)
RDW: 13.8 % (ref 12.3–15.4)
WBC: 3.8 10*3/uL (ref 3.4–10.8)

## 2017-08-27 LAB — VITAMIN D 25 HYDROXY (VIT D DEFICIENCY, FRACTURES): VIT D 25 HYDROXY: 30.2 ng/mL (ref 30.0–100.0)

## 2017-08-27 LAB — TSH: TSH: 2.58 u[IU]/mL (ref 0.450–4.500)

## 2017-08-27 LAB — LIPID PANEL W/O CHOL/HDL RATIO
Cholesterol, Total: 184 mg/dL (ref 100–199)
HDL: 64 mg/dL (ref >39)
LDL Calculated: 105 mg/dL — ABNORMAL HIGH (ref 0–99)
Triglycerides: 77 mg/dL (ref 0–149)
VLDL CHOLESTEROL CAL: 15 mg/dL (ref 5–40)

## 2017-08-29 ENCOUNTER — Encounter: Payer: Self-pay | Admitting: Unknown Physician Specialty

## 2017-09-23 ENCOUNTER — Encounter: Payer: Self-pay | Admitting: Unknown Physician Specialty

## 2017-09-23 ENCOUNTER — Ambulatory Visit (INDEPENDENT_AMBULATORY_CARE_PROVIDER_SITE_OTHER): Payer: Managed Care, Other (non HMO) | Admitting: Unknown Physician Specialty

## 2017-09-23 DIAGNOSIS — E559 Vitamin D deficiency, unspecified: Secondary | ICD-10-CM | POA: Insufficient documentation

## 2017-09-23 DIAGNOSIS — R454 Irritability and anger: Secondary | ICD-10-CM | POA: Diagnosis not present

## 2017-09-23 DIAGNOSIS — G43829 Menstrual migraine, not intractable, without status migrainosus: Secondary | ICD-10-CM

## 2017-09-23 DIAGNOSIS — R5383 Other fatigue: Secondary | ICD-10-CM | POA: Insufficient documentation

## 2017-09-23 NOTE — Progress Notes (Signed)
   BP 113/74   Pulse 87   Temp 98.2 F (36.8 C) (Oral)   SpO2 98%    Subjective:    Patient ID: Mary Booker, female    DOB: 09/29/1971, 46 y.o.   MRN: [id number]  HPI: Mary Booker is a 46 y.o. female  Chief Complaint  Patient presents with  . Follow-up    4 week f/up for irritability  . Migraine    pt states that the frova prescription was too expensive so she did not pick it up    Anxiety Pt states "I am fine."  She is not sure if it's the Buspar or if she is just better.    Dep[facility] screen [facility] 2[facility] 09/23/2017 08/26/2017 12/24/2016 11/26/2016 08/13/2016  Decreased Interest 0 0 1 1 0  Down, Depressed, Hopeless 1 0 0 1 0  PHQ - 2 Score 1 0 1 2 0  Altered sleeping 0 - - - 1  Tired, decreased energy 0 - - - 1  Change in appetite 1 - - - 0  Feeling bad or failure about yourself  0 - - - 0  Trouble concentrating - - - - 0  Moving slowly or fidgety/restless 0 - - - 0  Suicidal thoughts 0 - - - 0  PHQ-9 Score 2 - - - 2   Migraine States they are better.  Got a piercing that seems to help.  Abbe AmsterdamFrorva too expensive but not sure she needs anything else.    Relevant past medical, surgical, family and social history reviewed and updated as indicated. Interim medical history since our last visit reviewed. Allergies and medications reviewed and updated.  Review of Systems  Constitutional: Negative.   Respiratory: Negative.   Cardiovascular: Negative.     Per HPI unless specifically indicated above     Objective:    BP 113/74   Pulse 87   Temp 98.2 F (36.8 C) (Oral)   SpO2 98%   Wt Readings from Last 3 Encounters:  08/26/17 148 lb 4.8 oz (67.3 kg)  12/24/16 161 lb 12.8 oz (73.4 kg)  11/26/16 167 lb 3.2 oz (75.8 kg)    Physical Exam  Constitutional: She is oriented to person, place, and time. She appears well-developed and well-nourished. No distress.  HENT:  Head: Normocephalic and atraumatic.  Eyes: Conjunctivae and lids are normal. Right eye exhibits no discharge.  Left eye exhibits no discharge. No scleral icterus.  Neck: Normal range of motion. Neck supple. No JVD present. Carotid bruit is not present.  Abdominal: Normal appearance. There is no splenomegaly or hepatomegaly.  Musculoskeletal: Normal range of motion.  Neurological: She is alert and oriented to person, place, and time.  Skin: Skin is warm, dry and intact. No rash noted. No pallor.  Psychiatric: She has a normal mood and affect. Her behavior is normal. Judgment and thought content normal.      Assessment & Plan:   Problem List Items Addressed This Visit      Unprioritized   Irritable    Improved.  Will experiment off of Buspar.        Menstrual migraine    Stable for now off medications.            Follow up plan: Return if symptoms worsen or fail to improve.

## 2017-09-23 NOTE — Assessment & Plan Note (Signed)
Stable for now off medications.

## 2017-09-23 NOTE — Assessment & Plan Note (Signed)
Improved.  Will experiment off of Buspar.

## 2017-09-23 NOTE — Assessment & Plan Note (Signed)
History of.  Recheck Vit D

## 2017-09-23 NOTE — Assessment & Plan Note (Signed)
Check labs 

## 2017-09-25 ENCOUNTER — Other Ambulatory Visit: Payer: Self-pay | Admitting: Unknown Physician Specialty

## 2018-01-23 ENCOUNTER — Encounter: Payer: Self-pay | Admitting: Unknown Physician Specialty

## 2018-01-23 ENCOUNTER — Telehealth: Payer: Self-pay | Admitting: Unknown Physician Specialty

## 2018-01-23 NOTE — Telephone Encounter (Signed)
Copied from CRM [phone]. Topic: Quick Communication - See Telephone Encounter >> Jan 23, 2018  9:07 AM Diana EvesHoyt, Maryann B wrote: CRM for notification. See Telephone encounter for: 01/23/18. Pt states she has the flu and would like tama flu called in and a note emailed to her excusing her from work. 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

## 2018-01-24 MED ORDER — OSELTAMIVIR PHOSPHATE 75 MG PO CAPS: 75.0000 mg | ORAL_CAPSULE | Freq: Two times a day (BID) | ORAL | 0 refills | Status: DC

## 2018-03-13 ENCOUNTER — Other Ambulatory Visit: Payer: Self-pay | Admitting: Unknown Physician Specialty

## 2018-04-20 ENCOUNTER — Encounter: Payer: Self-pay | Admitting: Unknown Physician Specialty

## 2018-05-01 ENCOUNTER — Encounter: Payer: Self-pay | Admitting: Unknown Physician Specialty

## 2018-05-03 MED ORDER — NORETHINDRONE 0.35 MG PO TABS: ORAL_TABLET | ORAL | 1 refills | Status: DC

## 2018-05-22 ENCOUNTER — Encounter: Payer: Self-pay | Admitting: Unknown Physician Specialty

## 2018-08-14 ENCOUNTER — Encounter: Payer: Self-pay | Admitting: Family Medicine

## 2018-09-22 ENCOUNTER — Encounter: Payer: Managed Care, Other (non HMO) | Admitting: Unknown Physician Specialty

## 2018-09-29 ENCOUNTER — Encounter: Payer: Managed Care, Other (non HMO) | Admitting: Family Medicine

## 2018-10-02 ENCOUNTER — Encounter: Payer: Managed Care, Other (non HMO) | Admitting: Family Medicine

## 2018-10-06 ENCOUNTER — Encounter: Payer: Self-pay | Admitting: Family Medicine

## 2018-10-06 ENCOUNTER — Ambulatory Visit (INDEPENDENT_AMBULATORY_CARE_PROVIDER_SITE_OTHER): Payer: Commercial Managed Care - PPO | Admitting: Family Medicine

## 2018-10-06 ENCOUNTER — Other Ambulatory Visit: Payer: Self-pay | Admitting: Family Medicine

## 2018-10-06 VITALS — BP 111/73 | HR 67 | Temp 98.5°F | Ht 62.4 in | Wt 152.9 lb

## 2018-10-06 DIAGNOSIS — F329 Major depressive disorder, single episode, unspecified: Secondary | ICD-10-CM

## 2018-10-06 DIAGNOSIS — Z0000 Encounter for general adult medical examination without abnormal findings: Secondary | ICD-10-CM | POA: Diagnosis not present

## 2018-10-06 DIAGNOSIS — F32A Depression, unspecified: Secondary | ICD-10-CM

## 2018-10-06 DIAGNOSIS — F419 Anxiety disorder, unspecified: Secondary | ICD-10-CM

## 2018-10-06 DIAGNOSIS — Z1239 Encounter for other screening for malignant neoplasm of breast: Secondary | ICD-10-CM | POA: Diagnosis not present

## 2018-10-06 LAB — UA/M W/RFLX CULTURE, ROUTINE
BILIRUBIN UA: NEGATIVE
Glucose, UA: NEGATIVE
Ketones, UA: NEGATIVE
Leukocytes, UA: NEGATIVE
NITRITE UA: NEGATIVE
PH UA: 7.5 (ref 5.0–7.5)
PROTEIN UA: NEGATIVE
RBC UA: NEGATIVE
SPEC GRAV UA: 1.01 (ref 1.005–1.030)
UUROB: 0.2 mg/dL (ref 0.2–1.0)

## 2018-10-06 MED ORDER — BUPROPION HCL ER (XL) 150 MG PO TB24: 150.0000 mg | ORAL_TABLET | Freq: Every day | ORAL | 1 refills | Status: DC

## 2018-10-06 MED ORDER — BUPROPION HCL ER (XL) 150 MG PO TB24: 150.0000 mg | ORAL_TABLET | Freq: Every day | ORAL | 0 refills | Status: DC

## 2018-10-06 NOTE — Progress Notes (Signed)
BP 111/73   Pulse 67   Temp 98.5 F (36.9 C) (Oral)   Ht 5' 2.4" (1.585 m)   Wt 152 lb 14.4 oz (69.4 kg)   LMP 09/22/2018 (Exact Date)   SpO2 99%   BMI 27.61 kg/m    Subjective:    Patient ID: Mary Booker, female    DOB: 1971/08/18, 47 y.o.   MRN: [id number]  HPI: Mary Booker is a 47 y.o. female presenting on 10/06/2018 for comprehensive medical examination. Current medical complaints include:see below  Started zoloft for depression/anxiety and is doing well on that but having sexual side effects that are starting to affect her marriage. Wanting to try something else because of this. Denies SI/HI and feels her moods are stable.   She currently lives with: Menopausal Symptoms: no  Depression Screen done today and results listed below:  Depression screen [facility] 2/9 10/06/2018 09/23/2017 08/26/2017 12/24/2016 11/26/2016  Decreased Interest 0 0 0 1 1  Down, Depressed, Hopeless 0 1 0 0 1  PHQ - 2 Score 0 1 0 1 2  Altered sleeping 0 0 - - -  Tired, decreased energy 0 0 - - -  Change in appetite 0 1 - - -  Feeling bad or failure about yourself  0 0 - - -  Trouble concentrating 0 - - - -  Moving slowly or fidgety/restless 0 0 - - -  Suicidal thoughts 0 0 - - -  PHQ-9 Score 0 2 - - -  Difficult doing work/chores Not difficult at all - - - -    The patient does not have a history of falls. I did not complete a risk assessment for falls. A plan of care for falls was not documented.   Past Medical History:  History reviewed. No pertinent past medical history.  Surgical History:  Past Surgical History:  Procedure Laterality Date  . CESAREAN SECTION      Medications:  Current Outpatient Medications on File Prior to Visit  Medication Sig  . B Complex-C-Folic Acid (SM B SUPER VITAMIN COMPLEX) TABS Take 1 tablet by mouth daily.  . Magnesium 250 MG TABS Take 2 tablets by mouth daily.  . Multiple Vitamins-Minerals (CENTRUM MULTIGUMMIES) CHEW 2 gummies daily  . Prenatal MV-Min-Fe  Fum-FA-DHA (PRENATAL 1 PO) Take 1 tablet by mouth daily.  . vitamin C (ASCORBIC ACID) 500 MG tablet Take 1 tablet by mouth daily.  . Vitamin D, Cholecalciferol, 1000 units CAPS Take 2 capsules by mouth at bedtime.   No current facility-administered medications on file prior to visit.     Allergies:  Allergies  Allergen Reactions  . Penicillin G Benzathine Rash    Social History:  Social History   Socioeconomic History  . Marital status: Married    Spouse name: Not on file  . Number of children: Not on file  . Years of education: Not on file  . Highest education level: Not on file  Occupational History  . Not on file  Social Needs  . Financial resource strain: Not on file  . Food insecurity:    Worry: Not on file    Inability: Not on file  . Transportation needs:    Medical: Not on file    Non-medical: Not on file  Tobacco Use  . Smoking status: Never Smoker  . Smokeless tobacco: Never Used  Substance and Sexual Activity  . Alcohol use: No    Alcohol/week: 0.0 standard drinks  . Drug use: No  . Sexual  activity: Yes  Lifestyle  . Physical activity:    Days per week: Not on file    Minutes per session: Not on file  . Stress: Not on file  Relationships  . Social connections:    Talks on phone: Not on file    Gets together: Not on file    Attends religious service: Not on file    Active member of club or organization: Not on file    Attends meetings of clubs or organizations: Not on file    Relationship status: Not on file  . Intimate partner violence:    Fear of current or ex partner: Not on file    Emotionally abused: Not on file    Physically abused: Not on file    Forced sexual activity: Not on file  Other Topics Concern  . Not on file  Social History Narrative  . Not on file   Social History   Tobacco Use  Smoking Status Never Smoker  Smokeless Tobacco Never Used   Social History   Substance and Sexual Activity  Alcohol Use No  . Alcohol/week:  0.0 standard drinks    Family History:  Family History  Problem Relation Age of Onset  . Thyroid disease Mother   . Diabetes Father   . Hypertension Father   . Asthma Father   . Alzheimer's disease Maternal Grandmother   . Heart disease Paternal Grandmother   . Alzheimer's disease Paternal Grandmother   . Cancer Paternal Grandfather        lung  . Breast cancer Maternal Aunt     Past medical history, surgical history, medications, allergies, family history and social history reviewed with patient today and changes made to appropriate areas of the chart.   Review of Systems - General ROS: negative Psychological ROS: negative Ophthalmic ROS: negative ENT ROS: negative Allergy and Immunology ROS: negative Hematological and Lymphatic ROS: negative Endocrine ROS: negative Breast ROS: negative for breast lumps Respiratory ROS: no cough, shortness of breath, or wheezing Cardiovascular ROS: no chest pain or dyspnea on exertion Gastrointestinal ROS: no abdominal pain, change in bowel habits, or black or bloody stools Genito-Urinary ROS: positive for - decreased libido Musculoskeletal ROS: negative Neurological ROS: no TIA or stroke symptoms Dermatological ROS: negative All other ROS negative except what is listed above and in the HPI.      Objective:    BP 111/73   Pulse 67   Temp 98.5 F (36.9 C) (Oral)   Ht 5' 2.4" (1.585 m)   Wt 152 lb 14.4 oz (69.4 kg)   LMP 09/22/2018 (Exact Date)   SpO2 99%   BMI 27.61 kg/m   Wt Readings from Last 3 Encounters:  10/06/18 152 lb 14.4 oz (69.4 kg)  08/26/17 148 lb 4.8 oz (67.3 kg)  12/24/16 161 lb 12.8 oz (73.4 kg)    Physical Exam  Constitutional: She is oriented to person, place, and time. She appears well-developed and well-nourished. No distress.  HENT:  Head: Atraumatic.  Right Ear: External ear normal.  Left Ear: External ear normal.  Nose: Nose normal.  Mouth/Throat: Oropharynx is clear and moist. No oropharyngeal  exudate.  Eyes: Pupils are equal, round, and reactive to light. Conjunctivae and EOM are normal. No scleral icterus.  Neck: Normal range of motion. Neck supple. No thyromegaly present.  Cardiovascular: Normal rate, regular rhythm, normal heart sounds and intact distal pulses.  Pulmonary/Chest: Effort normal and breath sounds normal. No respiratory distress. Right breast exhibits no mass, no  nipple discharge, no skin change and no tenderness. Left breast exhibits no mass, no nipple discharge, no skin change and no tenderness.  Abdominal: Soft. Bowel sounds are normal. She exhibits no mass. There is no tenderness.  Musculoskeletal: Normal range of motion. She exhibits no edema or tenderness.  Lymphadenopathy:    She has no cervical adenopathy.  Neurological: She is alert and oriented to person, place, and time. No cranial nerve deficit.  Skin: Skin is warm and dry. No rash noted.  Psychiatric: She has a normal mood and affect. Her behavior is normal.  Nursing note and vitals reviewed.   Results for orders placed or performed in visit on 10/06/18  CBC with Differential/Platelet  Result Value Ref Range   WBC 4.6 3.4 - 10.8 x10E3/uL   RBC 3.81 3.77 - 5.28 x10E6/uL   Hemoglobin 10.9 (L) 11.1 - 15.9 g/dL   Hematocrit 62.9 (L) 52.8 - 46.6 %   MCV 87 79 - 97 fL   MCH 28.6 26.6 - 33.0 pg   MCHC 32.8 31.5 - 35.7 g/dL   RDW 41.3 (L) 24.4 - 01.0 %   Platelets 279 150 - 450 x10E3/uL   Neutrophils 55 Not Estab. %   Lymphs 33 Not Estab. %   Monocytes 6 Not Estab. %   Eos 4 Not Estab. %   Basos 1 Not Estab. %   Neutrophils Absolute 2.6 1.4 - 7.0 x10E3/uL   Lymphocytes Absolute 1.5 0.7 - 3.1 x10E3/uL   Monocytes Absolute 0.3 0.1 - 0.9 x10E3/uL   EOS (ABSOLUTE) 0.2 0.0 - 0.4 x10E3/uL   Basophils Absolute 0.0 0.0 - 0.2 x10E3/uL   Immature Granulocytes 1 Not Estab. %   Immature Grans (Abs) 0.1 0.0 - 0.1 x10E3/uL  Comprehensive metabolic panel  Result Value Ref Range   Glucose 82 65 - 99 mg/dL    BUN 7 6 - 24 mg/dL   Creatinine, Ser 2.72 0.57 - 1.00 mg/dL   GFR calc non Af Amer 104 >59 mL/min/1.73   GFR calc Af Amer 120 >59 mL/min/1.73   BUN/Creatinine Ratio 10 9 - 23   Sodium 139 134 - 144 mmol/L   Potassium 4.2 3.5 - 5.2 mmol/L   Chloride 100 96 - 106 mmol/L   CO2 26 20 - 29 mmol/L   Calcium 9.2 8.7 - 10.2 mg/dL   Total Protein 6.7 6.0 - 8.5 g/dL   Albumin 4.5 3.5 - 5.5 g/dL   Globulin, Total 2.2 1.5 - 4.5 g/dL   Albumin/Globulin Ratio 2.0 1.2 - 2.2   Bilirubin Total <0.2 0.0 - 1.2 mg/dL   Alkaline Phosphatase 48 39 - 117 IU/L   AST 14 0 - 40 IU/L   ALT 12 0 - 32 IU/L  Lipid Panel w/o Chol/HDL Ratio  Result Value Ref Range   Cholesterol, Total 180 100 - 199 mg/dL   Triglycerides 536 0 - 149 mg/dL   HDL 71 >64 mg/dL   VLDL Cholesterol Cal 21 5 - 40 mg/dL   LDL Calculated 88 0 - 99 mg/dL  TSH  Result Value Ref Range   TSH 1.270 0.450 - 4.500 uIU/mL      Assessment & Plan:   Problem List Items Addressed This Visit      Other   Anxiety and depression - Primary    Taper off zoloft and start wellbutrin d/t sexual side effects. Monitor moods closely.      Relevant Medications   buPROPion (WELLBUTRIN XL) 150 MG 24 hr tablet  Other Visit Diagnoses    Annual physical exam       Relevant Orders   CBC with Differential/Platelet (Completed)   Comprehensive metabolic panel (Completed)   Lipid Panel w/o Chol/HDL Ratio (Completed)   TSH (Completed)   UA/M w/rflx Culture, Routine   Screening for breast cancer       Relevant Orders   MM DIGITAL SCREENING BILATERAL       Follow up plan: Return in about 6 weeks (around 11/17/2018) for Depression f/u.   LABORATORY TESTING:  - Pap smear: up to date  IMMUNIZATIONS:   - Tdap: Tetanus vaccination status reviewed: last tetanus booster within 10 years. - Influenza: Up to date  SCREENING: -Mammogram: Ordered today   PATIENT COUNSELING:   Advised to take 1 mg of folate supplement per day if capable of pregnancy.     Sexuality: Discussed sexually transmitted diseases, partner selection, use of condoms, avoidance of unintended pregnancy  and contraceptive alternatives.   Advised to avoid cigarette smoking.  I discussed with the patient that most people either abstain from alcohol or drink within safe limits (<=14/week and <=4 drinks/occasion for males, <=7/weeks and <= 3 drinks/occasion for females) and that the risk for alcohol disorders and other health effects rises proportionally with the number of drinks per week and how often a drinker exceeds daily limits.  Discussed cessation/primary prevention of drug use and availability of treatment for abuse.   Diet: Encouraged to adjust caloric intake to maintain  or achieve ideal body weight, to reduce intake of dietary saturated fat and total fat, to limit sodium intake by avoiding high sodium foods and not adding table salt, and to maintain adequate dietary potassium and calcium preferably from fresh fruits, vegetables, and low-fat dairy products.    stressed the importance of regular exercise  Injury prevention: Discussed safety belts, safety helmets, smoke detector, smoking near bedding or upholstery.   Dental health: Discussed importance of regular tooth brushing, flossing, and dental visits.    NEXT PREVENTATIVE PHYSICAL DUE IN 1 YEAR. Return in about 6 weeks (around 11/17/2018) for Depression f/u.

## 2018-10-07 LAB — LIPID PANEL W/O CHOL/HDL RATIO
CHOLESTEROL TOTAL: 180 mg/dL (ref 100–199)
HDL: 71 mg/dL (ref >39)
LDL CALC: 88 mg/dL (ref 0–99)
Triglycerides: 105 mg/dL (ref 0–149)
VLDL CHOLESTEROL CAL: 21 mg/dL (ref 5–40)

## 2018-10-07 LAB — CBC WITH DIFFERENTIAL/PLATELET
BASOS: 1 %
Basophils Absolute: 0 10*3/uL (ref 0.0–0.2)
EOS (ABSOLUTE): 0.2 10*3/uL (ref 0.0–0.4)
EOS: 4 %
HEMATOCRIT: 33.2 % — AB (ref 34.0–46.6)
HEMOGLOBIN: 10.9 g/dL — AB (ref 11.1–15.9)
Immature Grans (Abs): 0.1 10*3/uL (ref 0.0–0.1)
Immature Granulocytes: 1 %
LYMPHS ABS: 1.5 10*3/uL (ref 0.7–3.1)
Lymphs: 33 %
MCH: 28.6 pg (ref 26.6–33.0)
MCHC: 32.8 g/dL (ref 31.5–35.7)
MCV: 87 fL (ref 79–97)
MONOCYTES: 6 %
Monocytes Absolute: 0.3 10*3/uL (ref 0.1–0.9)
Neutrophils Absolute: 2.6 10*3/uL (ref 1.4–7.0)
Neutrophils: 55 %
Platelets: 279 10*3/uL (ref 150–450)
RBC: 3.81 x10E6/uL (ref 3.77–5.28)
RDW: 12.2 % — ABNORMAL LOW (ref 12.3–15.4)
WBC: 4.6 10*3/uL (ref 3.4–10.8)

## 2018-10-07 LAB — COMPREHENSIVE METABOLIC PANEL
ALBUMIN: 4.5 g/dL (ref 3.5–5.5)
ALK PHOS: 48 IU/L (ref 39–117)
ALT: 12 IU/L (ref 0–32)
AST: 14 IU/L (ref 0–40)
Albumin/Globulin Ratio: 2 (ref 1.2–2.2)
BUN / CREAT RATIO: 10 (ref 9–23)
BUN: 7 mg/dL (ref 6–24)
CALCIUM: 9.2 mg/dL (ref 8.7–10.2)
CO2: 26 mmol/L (ref 20–29)
CREATININE: 0.69 mg/dL (ref 0.57–1.00)
Chloride: 100 mmol/L (ref 96–106)
GFR calc non Af Amer: 104 mL/min/{1.73_m2} (ref >59)
GFR, EST AFRICAN AMERICAN: 120 mL/min/{1.73_m2} (ref >59)
GLOBULIN, TOTAL: 2.2 g/dL (ref 1.5–4.5)
Glucose: 82 mg/dL (ref 65–99)
Potassium: 4.2 mmol/L (ref 3.5–5.2)
SODIUM: 139 mmol/L (ref 134–144)
Total Protein: 6.7 g/dL (ref 6.0–8.5)

## 2018-10-07 LAB — TSH: TSH: 1.27 u[IU]/mL (ref 0.450–4.500)

## 2018-10-10 DIAGNOSIS — F419 Anxiety disorder, unspecified: Principal | ICD-10-CM

## 2018-10-10 DIAGNOSIS — F32A Depression, unspecified: Secondary | ICD-10-CM | POA: Insufficient documentation

## 2018-10-10 DIAGNOSIS — F329 Major depressive disorder, single episode, unspecified: Secondary | ICD-10-CM | POA: Insufficient documentation

## 2018-10-10 NOTE — Assessment & Plan Note (Signed)
Taper off zoloft and start wellbutrin d/t sexual side effects. Monitor moods closely.

## 2018-10-30 ENCOUNTER — Encounter: Payer: Self-pay | Admitting: Family Medicine

## 2018-11-06 ENCOUNTER — Other Ambulatory Visit: Payer: Self-pay

## 2018-11-06 MED ORDER — BUPROPION HCL ER (XL) 150 MG PO TB24: 150.0000 mg | ORAL_TABLET | Freq: Every day | ORAL | 0 refills | Status: DC

## 2018-11-06 NOTE — Telephone Encounter (Signed)
Refill and 90 day supply request for Bupropion 150mg  tablet. Next appt 11/17/2018

## 2018-11-10 ENCOUNTER — Ambulatory Visit: Payer: Commercial Managed Care - PPO | Admitting: Family Medicine

## 2018-11-16 ENCOUNTER — Ambulatory Visit
Admission: RE | Admit: 2018-11-16 | Discharge: 2018-11-16 | Disposition: A | Discharge status: Home / Self Care | Payer: Commercial Managed Care - PPO | Source: Ambulatory Visit | Attending: Family Medicine | Admitting: Family Medicine

## 2018-11-16 DIAGNOSIS — Z1239 Encounter for other screening for malignant neoplasm of breast: Secondary | ICD-10-CM | POA: Diagnosis not present

## 2018-11-17 ENCOUNTER — Ambulatory Visit: Payer: Commercial Managed Care - PPO | Admitting: Family Medicine

## 2018-11-17 ENCOUNTER — Encounter: Payer: Self-pay | Admitting: Family Medicine

## 2018-11-17 VITALS — BP 104/70 | HR 96 | Temp 98.3°F | Wt 159.3 lb

## 2018-11-17 DIAGNOSIS — F329 Major depressive disorder, single episode, unspecified: Secondary | ICD-10-CM

## 2018-11-17 DIAGNOSIS — F419 Anxiety disorder, unspecified: Secondary | ICD-10-CM

## 2018-11-17 DIAGNOSIS — F32A Depression, unspecified: Secondary | ICD-10-CM

## 2018-11-17 MED ORDER — BUPROPION HCL ER (XL) 150 MG PO TB24: 150.0000 mg | ORAL_TABLET | Freq: Every day | ORAL | 1 refills | Status: DC

## 2018-11-17 NOTE — Progress Notes (Signed)
BP 104/70   Pulse 96   Temp 98.3 F (36.8 C) (Oral)   Wt 159 lb 4.8 oz (72.3 kg)   LMP 11/06/2018 (Exact Date)   SpO2 100%   BMI 28.76 kg/m    Subjective:    Patient ID: Mary Booker, female    DOB: Mar 31, 1971, 48 y.o.   MRN: [id number]  HPI: Mary Booker is a 48 y.o. female  Chief Complaint  Patient presents with  . Depression   Here today for 1 month f/u after switching to wellbutrin d/t sexual side effects with SSRI. Doing very well on current regimen, no side effects noted so far other than increased appetite the past week which is abnormal for her. Denies SI/HI, sleep issues.   Depression screen [facility] 2/9 11/17/2018 10/06/2018 09/23/2017  Decreased Interest 0 0 0  Down, Depressed, Hopeless 0 0 1  PHQ - 2 Score 0 0 1  Altered sleeping 0 0 0  Tired, decreased energy 0 0 0  Change in appetite 2 0 1  Feeling bad or failure about yourself  0 0 0  Trouble concentrating 0 0 -  Moving slowly or fidgety/restless 0 0 0  Suicidal thoughts 0 0 0  PHQ-9 Score 2 0 2  Difficult doing work/chores - Not difficult at all -    Relevant past medical, surgical, family and social history reviewed and updated as indicated. Interim medical history since our last visit reviewed. Allergies and medications reviewed and updated.  Review of Systems  Per HPI unless specifically indicated above     Objective:    BP 104/70   Pulse 96   Temp 98.3 F (36.8 C) (Oral)   Wt 159 lb 4.8 oz (72.3 kg)   LMP 11/06/2018 (Exact Date)   SpO2 100%   BMI 28.76 kg/m   Wt Readings from Last 3 Encounters:  11/17/18 159 lb 4.8 oz (72.3 kg)  10/06/18 152 lb 14.4 oz (69.4 kg)  08/26/17 148 lb 4.8 oz (67.3 kg)    Physical Exam Vitals signs and nursing note reviewed.  Constitutional:      Appearance: Normal appearance. She is not ill-appearing.  HENT:     Head: Atraumatic.  Eyes:     Extraocular Movements: Extraocular movements intact.     Conjunctiva/sclera: Conjunctivae normal.  Neck:   Musculoskeletal: Normal range of motion and neck supple.  Cardiovascular:     Rate and Rhythm: Normal rate and regular rhythm.     Heart sounds: Normal heart sounds.  Pulmonary:     Effort: Pulmonary effort is normal.     Breath sounds: Normal breath sounds.  Musculoskeletal: Normal range of motion.  Skin:    General: Skin is warm and dry.  Neurological:     Mental Status: She is alert and oriented to person, place, and time.  Psychiatric:        Mood and Affect: Mood normal.        Thought Content: Thought content normal.        Judgment: Judgment normal.     Results for orders placed or performed in visit on 10/06/18  UA/M w/rflx Culture, Routine  Result Value Ref Range   Specific Gravity, UA 1.010 1.005 - 1.030   pH, UA 7.5 5.0 - 7.5   Color, UA Yellow Yellow   Appearance Ur Clear Clear   Leukocytes, UA Negative Negative   Protein, UA Negative Negative/Trace   Glucose, UA Negative Negative   Ketones, UA Negative Negative   RBC,  UA Negative Negative   Bilirubin, UA Negative Negative   Urobilinogen, Ur 0.2 0.2 - 1.0 mg/dL   Nitrite, UA Negative Negative      Assessment & Plan:   Problem List Items Addressed This Visit      Other   Anxiety and depression - Primary    Doing very well on wellbutrin 150 mg XL, continue current regimen. Discussed that increased appetite is not a typical side effect of the medicine but that if she continues to experience that to call and we can discuss switching things up.       Relevant Medications   buPROPion (WELLBUTRIN XL) 150 MG 24 hr tablet       Follow up plan: Return in about 6 months (around 05/18/2019) for Mood f/u.

## 2018-11-17 NOTE — Assessment & Plan Note (Signed)
Doing very well on wellbutrin 150 mg XL, continue current regimen. Discussed that increased appetite is not a typical side effect of the medicine but that if she continues to experience that to call and we can discuss switching things up.

## 2019-01-18 ENCOUNTER — Other Ambulatory Visit: Payer: Self-pay | Admitting: Family Medicine

## 2019-01-18 NOTE — Telephone Encounter (Signed)
Pharmacy requesting change to 90 day supply- sent for PCP review.

## 2019-01-23 ENCOUNTER — Encounter: Payer: Self-pay | Admitting: Family Medicine

## 2019-05-18 ENCOUNTER — Ambulatory Visit: Payer: Commercial Managed Care - PPO | Admitting: Family Medicine

## 2019-05-18 ENCOUNTER — Other Ambulatory Visit: Payer: Self-pay

## 2019-05-18 ENCOUNTER — Encounter: Payer: Self-pay | Admitting: Family Medicine

## 2019-05-18 VITALS — BP 114/78 | HR 87 | Temp 98.0°F | Ht 62.0 in | Wt 156.0 lb

## 2019-05-18 DIAGNOSIS — F32A Depression, unspecified: Secondary | ICD-10-CM

## 2019-05-18 DIAGNOSIS — F329 Major depressive disorder, single episode, unspecified: Secondary | ICD-10-CM | POA: Diagnosis not present

## 2019-05-18 DIAGNOSIS — F419 Anxiety disorder, unspecified: Secondary | ICD-10-CM

## 2019-05-18 MED ORDER — BUPROPION HCL ER (XL) 150 MG PO TB24: 150.0000 mg | ORAL_TABLET | Freq: Every day | ORAL | 1 refills | Status: DC

## 2019-05-18 NOTE — Progress Notes (Signed)
BP 114/78   Pulse 87   Temp 98 F (36.7 C) (Oral)   Ht 5\' 2"  (1.575 m)   Wt 156 lb (70.8 kg)   SpO2 100%   BMI 28.53 kg/m    Subjective:    Patient ID: Mary Booker, female    DOB: 02/18/1971, 48 y.o.   MRN: [id number]  HPI: Mary GrimesJerrie Strehl is a 48 y.o. female  Chief Complaint  Patient presents with  . Anxiety    6336m f/u  . Depression   Here today for 6 month anxiety and depression follow up. Currently on wellbutrin and doing very well. No side effects, taking faithfully. Denies SI/HI, frequent panic episodes, sleep or appetite concerns. States she feels great.   [facility] 2/9 05/18/2019 11/17/2018 10/06/2018  Decreased Interest 0 0 0  Down, Depressed, Hopeless 0 0 0  PHQ - 2 Score 0 0 0  Altered sleeping 0 0 0  Tired, decreased energy 0 0 0  Change in appetite 0 2 0  Feeling bad or failure about yourself  0 0 0  Trouble concentrating 0 0 0  Moving slowly or fidgety/restless 0 0 0  Suicidal thoughts 0 0 0  PHQ-9 Score 0 2 0  Difficult doing work/chores - - Not difficult at all   GAD 7 : Generalized Anxiety Score 05/18/2019  Nervous, Anxious, on Edge 0  Control/stop worrying 0  Worry too much - different things 0  Trouble relaxing 0  Restless 0  Easily annoyed or irritable 0  Afraid - awful might happen 0  Total GAD 7 Score 0   Relevant past medical, surgical, family and social history reviewed and updated as indicated. Interim medical history since our last visit reviewed. Allergies and medications reviewed and updated.  Review of Systems  Per HPI unless specifically indicated above     Objective:    BP 114/78   Pulse 87   Temp 98 F (36.7 C) (Oral)   Ht 5\' 2"  (1.575 m)   Wt 156 lb (70.8 kg)   SpO2 100%   BMI 28.53 kg/m   Wt Readings from Last 3 Encounters:  05/18/19 156 lb (70.8 kg)  11/17/18 159 lb 4.8 oz (72.3 kg)  10/06/18 152 lb 14.4 oz (69.4 kg)    Physical Exam Vitals signs and nursing note reviewed.  Constitutional:    Appearance: Normal appearance. She is not ill-appearing.  HENT:     Head: Atraumatic.  Eyes:     Extraocular Movements: Extraocular movements intact.     Conjunctiva/sclera: Conjunctivae normal.  Neck:     Musculoskeletal: Normal range of motion and neck supple.  Cardiovascular:     Rate and Rhythm: Normal rate and regular rhythm.     Heart sounds: Normal heart sounds.  Pulmonary:     Effort: Pulmonary effort is normal.     Breath sounds: Normal breath sounds.  Musculoskeletal: Normal range of motion.  Skin:    General: Skin is warm and dry.  Neurological:     Mental Status: She is alert and oriented to person, place, and time.  Psychiatric:        Mood and Affect: Mood normal.        Thought Content: Thought content normal.        Judgment: Judgment normal.     Results for orders placed or performed in visit on 10/06/18  UA/M w/rflx Culture, Routine  Result Value Ref Range   Specific Gravity, UA 1.010 1.005 - 1.030  pH, UA 7.5 5.0 - 7.5   Color, UA Yellow Yellow   Appearance Ur Clear Clear   Leukocytes, UA Negative Negative   Protein, UA Negative Negative/Trace   Glucose, UA Negative Negative   Ketones, UA Negative Negative   RBC, UA Negative Negative   Bilirubin, UA Negative Negative   Urobilinogen, Ur 0.2 0.2 - 1.0 mg/dL   Nitrite, UA Negative Negative      Assessment & Plan:   Problem List Items Addressed This Visit      Other   Anxiety and depression - Primary    Stable and under good control on wellbutrin, continue current regimen      Relevant Medications   buPROPion (WELLBUTRIN XL) 150 MG 24 hr tablet       Follow up plan: Return in about 6 months (around 11/18/2019) for CPE.

## 2019-05-21 NOTE — Assessment & Plan Note (Signed)
Stable and under good control on wellbutrin, continue current regimen 

## 2019-10-04 ENCOUNTER — Other Ambulatory Visit: Payer: Self-pay | Admitting: Family Medicine

## 2019-10-04 DIAGNOSIS — Z1231 Encounter for screening mammogram for malignant neoplasm of breast: Secondary | ICD-10-CM

## 2019-10-17 ENCOUNTER — Encounter: Payer: Self-pay | Admitting: Family Medicine

## 2019-11-23 ENCOUNTER — Ambulatory Visit
Admission: RE | Admit: 2019-11-23 | Discharge: 2019-11-23 | Disposition: A | Discharge status: Home / Self Care | Payer: Commercial Managed Care - PPO | Source: Ambulatory Visit | Attending: Family Medicine | Admitting: Family Medicine

## 2019-11-23 ENCOUNTER — Other Ambulatory Visit: Payer: Self-pay

## 2019-11-23 ENCOUNTER — Encounter: Payer: Self-pay | Admitting: Family Medicine

## 2019-11-23 ENCOUNTER — Ambulatory Visit (INDEPENDENT_AMBULATORY_CARE_PROVIDER_SITE_OTHER): Payer: Commercial Managed Care - PPO | Admitting: Family Medicine

## 2019-11-23 VITALS — BP 119/81 | HR 94 | Temp 98.4°F | Ht 61.9 in | Wt 160.0 lb

## 2019-11-23 DIAGNOSIS — F329 Major depressive disorder, single episode, unspecified: Secondary | ICD-10-CM | POA: Diagnosis not present

## 2019-11-23 DIAGNOSIS — F419 Anxiety disorder, unspecified: Secondary | ICD-10-CM

## 2019-11-23 DIAGNOSIS — F32A Depression, unspecified: Secondary | ICD-10-CM

## 2019-11-23 DIAGNOSIS — Z0000 Encounter for general adult medical examination without abnormal findings: Secondary | ICD-10-CM

## 2019-11-23 DIAGNOSIS — Z1231 Encounter for screening mammogram for malignant neoplasm of breast: Secondary | ICD-10-CM | POA: Diagnosis not present

## 2019-11-23 LAB — MICROSCOPIC EXAMINATION: Bacteria, UA: NONE SEEN

## 2019-11-23 LAB — UA/M W/RFLX CULTURE, ROUTINE
Bilirubin, UA: NEGATIVE
Glucose, UA: NEGATIVE
Ketones, UA: NEGATIVE
Leukocytes,UA: NEGATIVE
Nitrite, UA: NEGATIVE
Protein,UA: NEGATIVE
Specific Gravity, UA: 1.015 (ref 1.005–1.030)
Urobilinogen, Ur: 0.2 mg/dL (ref 0.2–1.0)
pH, UA: 7 (ref 5.0–7.5)

## 2019-11-23 NOTE — Assessment & Plan Note (Signed)
Discussed taper schedule to come off the medication. F/u if having subsequent mood or anxiety issues once off the medication

## 2019-11-23 NOTE — Progress Notes (Signed)
BP 119/81   Pulse 94   Temp 98.4 F (36.9 C) (Oral)   Ht 5' 1.9" (1.572 m)   Wt 160 lb (72.6 kg)   SpO2 98%   BMI 29.36 kg/m    Subjective:    Patient ID: Mary Booker, female    DOB: Mar 13, 1971, 49 y.o.   MRN: [id number]  HPI: Mary Booker is a 49 y.o. female presenting on 11/23/2019 for comprehensive medical examination. Current medical complaints include:none  Wanting to taper off wellbutrin, feels moods/anxiety are in a very good place at this point.   She currently lives with: Menopausal Symptoms: no  Depression Screen done today and results listed below:  Depression screen [facility] 2/9 05/18/2019 11/17/2018 10/06/2018 09/23/2017 08/26/2017  Decreased Interest 0 0 0 0 0  Down, Depressed, Hopeless 0 0 0 1 0  PHQ - 2 Score 0 0 0 1 0  Altered sleeping 0 0 0 0 -  Tired, decreased energy 0 0 0 0 -  Change in appetite 0 2 0 1 -  Feeling bad or failure about yourself  0 0 0 0 -  Trouble concentrating 0 0 0 - -  Moving slowly or fidgety/restless 0 0 0 0 -  Suicidal thoughts 0 0 0 0 -  PHQ-9 Score 0 2 0 2 -  Difficult doing work/chores - - Not difficult at all - -    The patient does not have a history of falls. I did complete a risk assessment for falls. A plan of care for falls was documented.   Past Medical History:  No past medical history on file.  Surgical History:  Past Surgical History:  Procedure Laterality Date  . CESAREAN SECTION      Medications:  Current Outpatient Medications on File Prior to Visit  Medication Sig  . B Complex-C-Folic Acid (SM B SUPER VITAMIN COMPLEX) TABS Take 1 tablet by mouth daily.  . Magnesium 250 MG TABS Take 2 tablets by mouth daily.  . Multiple Vitamins-Minerals (CENTRUM MULTIGUMMIES) CHEW 2 gummies daily  . Prenatal MV-Min-Fe Fum-FA-DHA (PRENATAL 1 PO) Take 1 tablet by mouth daily.  . vitamin C (ASCORBIC ACID) 500 MG tablet Take 1 tablet by mouth daily.  . Vitamin D, Cholecalciferol, 1000 units CAPS Take 2 capsules by mouth at  bedtime.   No current facility-administered medications on file prior to visit.    Allergies:  Allergies  Allergen Reactions  . Penicillin G Benzathine Rash    Social History:  Social History   Socioeconomic History  . Marital status: Married    Spouse name: Not on file  . Number of children: Not on file  . Years of education: Not on file  . Highest education level: Not on file  Occupational History  . Not on file  Tobacco Use  . Smoking status: Never Smoker  . Smokeless tobacco: Never Used  Substance and Sexual Activity  . Alcohol use: No    Alcohol/week: 0.0 standard drinks  . Drug use: No  . Sexual activity: Yes  Other Topics Concern  . Not on file  Social History Narrative  . Not on file   Social Determinants of Health   Financial Resource Strain:   . Difficulty of Paying Living Expenses: Not on file  Food Insecurity:   . Worried About Programme researcher, broadcasting/film/video in the Last Year: Not on file  . Ran Out of Food in the Last Year: Not on file  Transportation Needs:   . Lack of  Transportation (Medical): Not on file  . Lack of Transportation (Non-Medical): Not on file  Physical Activity:   . Days of Exercise per Week: Not on file  . Minutes of Exercise per Session: Not on file  Stress:   . Feeling of Stress : Not on file  Social Connections:   . Frequency of Communication with Friends and Family: Not on file  . Frequency of Social Gatherings with Friends and Family: Not on file  . Attends Religious Services: Not on file  . Active Member of Clubs or Organizations: Not on file  . Attends Banker Meetings: Not on file  . Marital Status: Not on file  Intimate Partner Violence:   . Fear of Current or Ex-Partner: Not on file  . Emotionally Abused: Not on file  . Physically Abused: Not on file  . Sexually Abused: Not on file   Social History   Tobacco Use  Smoking Status Never Smoker  Smokeless Tobacco Never Used   Social History   Substance and  Sexual Activity  Alcohol Use No  . Alcohol/week: 0.0 standard drinks    Family History:  Family History  Problem Relation Age of Onset  . Thyroid disease Mother   . Diabetes Father   . Hypertension Father   . Asthma Father   . Alzheimer's disease Maternal Grandmother   . Heart disease Paternal Grandmother   . Alzheimer's disease Paternal Grandmother   . Cancer Paternal Grandfather        lung  . Breast cancer Maternal Aunt     Past medical history, surgical history, medications, allergies, family history and social history reviewed with patient today and changes made to appropriate areas of the chart.   Review of Systems - General ROS: negative Psychological ROS: negative Ophthalmic ROS: negative ENT ROS: negative Allergy and Immunology ROS: negative Hematological and Lymphatic ROS: negative Endocrine ROS: negative Breast ROS: negative for breast lumps Respiratory ROS: no cough, shortness of breath, or wheezing Cardiovascular ROS: no chest pain or dyspnea on exertion Gastrointestinal ROS: no abdominal pain, change in bowel habits, or black or bloody stools Genito-Urinary ROS: no dysuria, trouble voiding, or hematuria Musculoskeletal ROS: negative Neurological ROS: no TIA or stroke symptoms Dermatological ROS: negative All other ROS negative except what is listed above and in the HPI.      Objective:    BP 119/81   Pulse 94   Temp 98.4 F (36.9 C) (Oral)   Ht 5' 1.9" (1.572 m)   Wt 160 lb (72.6 kg)   SpO2 98%   BMI 29.36 kg/m   Wt Readings from Last 3 Encounters:  11/23/19 160 lb (72.6 kg)  05/18/19 156 lb (70.8 kg)  11/17/18 159 lb 4.8 oz (72.3 kg)    Physical Exam Vitals and nursing note reviewed.  Constitutional:      General: She is not in acute distress.    Appearance: She is well-developed.  HENT:     Head: Atraumatic.     Right Ear: External ear normal.     Left Ear: External ear normal.     Nose: Nose normal.     Mouth/Throat:     Pharynx: No  oropharyngeal exudate.  Eyes:     General: No scleral icterus.    Conjunctiva/sclera: Conjunctivae normal.     Pupils: Pupils are equal, round, and reactive to light.  Neck:     Thyroid: No thyromegaly.  Cardiovascular:     Rate and Rhythm: Normal rate and regular  rhythm.     Heart sounds: Normal heart sounds.  Pulmonary:     Effort: Pulmonary effort is normal. No respiratory distress.     Breath sounds: Normal breath sounds.  Chest:     Breasts:        Right: No mass, skin change or tenderness.        Left: No mass, skin change or tenderness.  Abdominal:     General: Bowel sounds are normal.     Palpations: Abdomen is soft. There is no mass.     Tenderness: There is no abdominal tenderness.  Genitourinary:    Comments: GU exam declined Musculoskeletal:        General: No tenderness. Normal range of motion.     Cervical back: Normal range of motion and neck supple.  Lymphadenopathy:     Cervical: No cervical adenopathy.     Upper Body:     Right upper body: No axillary adenopathy.     Left upper body: No axillary adenopathy.  Skin:    General: Skin is warm and dry.     Findings: No rash.  Neurological:     Mental Status: She is alert and oriented to person, place, and time.     Cranial Nerves: No cranial nerve deficit.  Psychiatric:        Behavior: Behavior normal.     Results for orders placed or performed in visit on 10/06/18  UA/M w/rflx Culture, Routine  Result Value Ref Range   Specific Gravity, UA 1.010 1.005 - 1.030   pH, UA 7.5 5.0 - 7.5   Color, UA Yellow Yellow   Appearance Ur Clear Clear   Leukocytes, UA Negative Negative   Protein, UA Negative Negative/Trace   Glucose, UA Negative Negative   Ketones, UA Negative Negative   RBC, UA Negative Negative   Bilirubin, UA Negative Negative   Urobilinogen, Ur 0.2 0.2 - 1.0 mg/dL   Nitrite, UA Negative Negative      Assessment & Plan:   Problem List Items Addressed This Visit      Other   Anxiety and  depression - Primary    Discussed taper schedule to come off the medication. F/u if having subsequent mood or anxiety issues once off the medication       Other Visit Diagnoses    Annual physical exam       Relevant Orders   CBC with Differential/Platelet   Comprehensive metabolic panel   Lipid Panel w/o Chol/HDL Ratio   TSH   UA/M w/rflx Culture, Routine       Follow up plan: Return in about 1 year (around 11/22/2020) for CPE.   LABORATORY TESTING:  - Pap smear: up to date  IMMUNIZATIONS:   - Tdap: Tetanus vaccination status reviewed: last tetanus booster within 10 years. - Influenza: Up to date  SCREENING: -Mammogram: scheduled for later today   PATIENT COUNSELING:   Advised to take 1 mg of folate supplement per day if capable of pregnancy.   Sexuality: Discussed sexually transmitted diseases, partner selection, use of condoms, avoidance of unintended pregnancy  and contraceptive alternatives.   Advised to avoid cigarette smoking.  I discussed with the patient that most people either abstain from alcohol or drink within safe limits (<=14/week and <=4 drinks/occasion for males, <=7/weeks and <= 3 drinks/occasion for females) and that the risk for alcohol disorders and other health effects rises proportionally with the number of drinks per week and how often a drinker exceeds daily  limits.  Discussed cessation/primary prevention of drug use and availability of treatment for abuse.   Diet: Encouraged to adjust caloric intake to maintain  or achieve ideal body weight, to reduce intake of dietary saturated fat and total fat, to limit sodium intake by avoiding high sodium foods and not adding table salt, and to maintain adequate dietary potassium and calcium preferably from fresh fruits, vegetables, and low-fat dairy products.    stressed the importance of regular exercise  Injury prevention: Discussed safety belts, safety helmets, smoke detector, smoking near bedding or  upholstery.   Dental health: Discussed importance of regular tooth brushing, flossing, and dental visits.    NEXT PREVENTATIVE PHYSICAL DUE IN 1 YEAR. Return in about 1 year (around 11/22/2020) for CPE.

## 2019-11-24 LAB — CBC WITH DIFFERENTIAL/PLATELET
Basophils Absolute: 0.1 10*3/uL (ref 0.0–0.2)
Basos: 1 %
EOS (ABSOLUTE): 0.2 10*3/uL (ref 0.0–0.4)
Eos: 5 %
Hematocrit: 37 % (ref 34.0–46.6)
Hemoglobin: 12.3 g/dL (ref 11.1–15.9)
Immature Grans (Abs): 0 10*3/uL (ref 0.0–0.1)
Immature Granulocytes: 0 %
Lymphocytes Absolute: 1.3 10*3/uL (ref 0.7–3.1)
Lymphs: 31 %
MCH: 29.5 pg (ref 26.6–33.0)
MCHC: 33.2 g/dL (ref 31.5–35.7)
MCV: 89 fL (ref 79–97)
Monocytes Absolute: 0.3 10*3/uL (ref 0.1–0.9)
Monocytes: 8 %
Neutrophils Absolute: 2.2 10*3/uL (ref 1.4–7.0)
Neutrophils: 55 %
Platelets: 231 10*3/uL (ref 150–450)
RBC: 4.17 x10E6/uL (ref 3.77–5.28)
RDW: 12.1 % (ref 11.7–15.4)
WBC: 4 10*3/uL (ref 3.4–10.8)

## 2019-11-24 LAB — COMPREHENSIVE METABOLIC PANEL
ALT: 10 IU/L (ref 0–32)
AST: 15 IU/L (ref 0–40)
Albumin/Globulin Ratio: 1.9 (ref 1.2–2.2)
Albumin: 4.4 g/dL (ref 3.8–4.8)
Alkaline Phosphatase: 47 IU/L (ref 39–117)
BUN/Creatinine Ratio: 12 (ref 9–23)
BUN: 10 mg/dL (ref 6–24)
Bilirubin Total: <0.2 mg/dL (ref 0.0–1.2)
CO2: 27 mmol/L (ref 20–29)
Calcium: 9.2 mg/dL (ref 8.7–10.2)
Chloride: 101 mmol/L (ref 96–106)
Creatinine, Ser: 0.83 mg/dL (ref 0.57–1.00)
GFR calc Af Amer: 96 mL/min/{1.73_m2} (ref >59)
GFR calc non Af Amer: 83 mL/min/{1.73_m2} (ref >59)
Globulin, Total: 2.3 g/dL (ref 1.5–4.5)
Glucose: 88 mg/dL (ref 65–99)
Potassium: 4.3 mmol/L (ref 3.5–5.2)
Sodium: 141 mmol/L (ref 134–144)
Total Protein: 6.7 g/dL (ref 6.0–8.5)

## 2019-11-24 LAB — LIPID PANEL W/O CHOL/HDL RATIO
Cholesterol, Total: 201 mg/dL — ABNORMAL HIGH (ref 100–199)
HDL: 75 mg/dL (ref >39)
LDL Chol Calc (NIH): 114 mg/dL — ABNORMAL HIGH (ref 0–99)
Triglycerides: 66 mg/dL (ref 0–149)
VLDL Cholesterol Cal: 12 mg/dL (ref 5–40)

## 2019-11-24 LAB — TSH: TSH: 1.31 u[IU]/mL (ref 0.450–4.500)

## 2019-11-26 ENCOUNTER — Telehealth: Payer: Self-pay

## 2019-11-26 NOTE — Telephone Encounter (Signed)
Pt's son [place] will pick up paperwork today.

## 2019-11-26 NOTE — Telephone Encounter (Signed)
Called patient to let him know paperwork is ready for pick up. LVM for patient to return call or to come by.

## 2019-11-27 ENCOUNTER — Other Ambulatory Visit: Payer: Self-pay | Admitting: Family Medicine

## 2019-11-27 ENCOUNTER — Encounter: Payer: Self-pay | Admitting: Family Medicine

## 2019-11-28 ENCOUNTER — Encounter: Payer: Self-pay | Admitting: Family Medicine

## 2019-11-28 NOTE — Telephone Encounter (Signed)
Patient's son will pick up the paper.

## 2020-06-17 ENCOUNTER — Encounter: Payer: Self-pay | Admitting: Family Medicine

## 2020-10-17 IMAGING — MG DIGITAL SCREENING BILATERAL MAMMOGRAM WITH TOMO AND CAD
8 series · 9 of 24 positions shown · non-contrast
Comparison: Previous exam(s).

CLINICAL DATA: Screening.

EXAM:
DIGITAL SCREENING BILATERAL MAMMOGRAM WITH TOMO AND CAD

[L CC synth-2D]
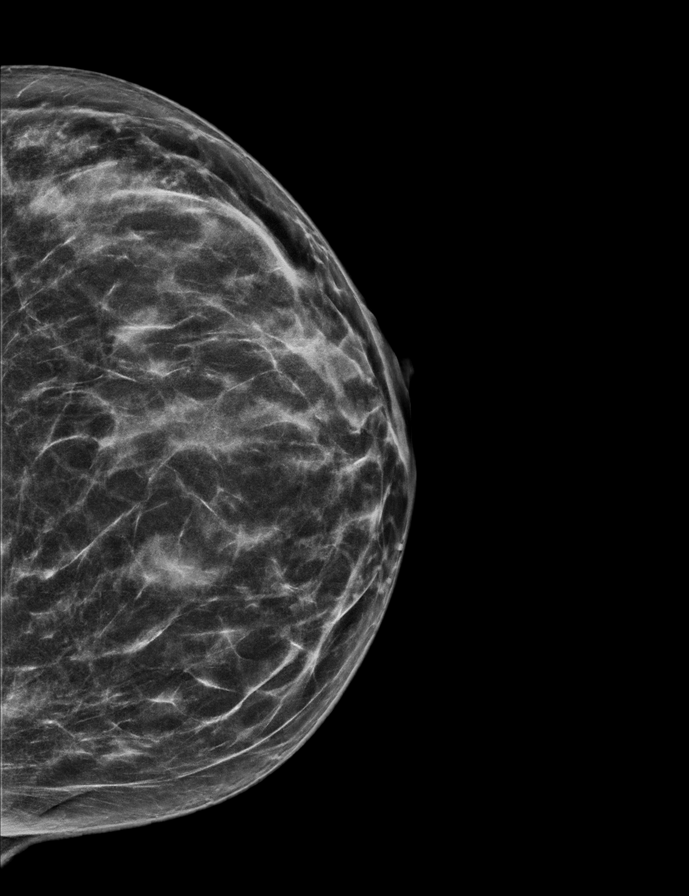

[L MLO synth-2D]
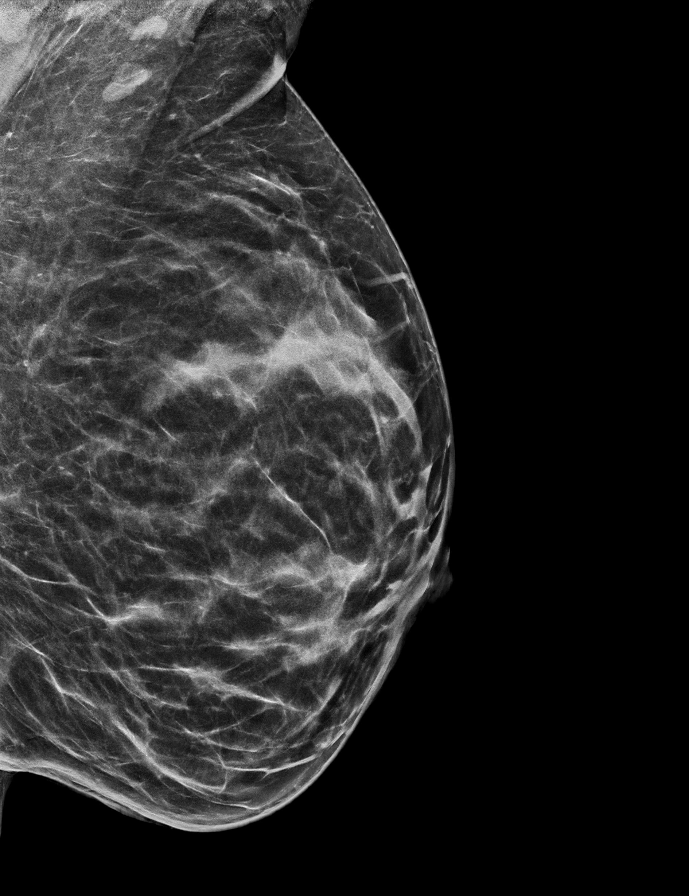

[R CC synth-2D]
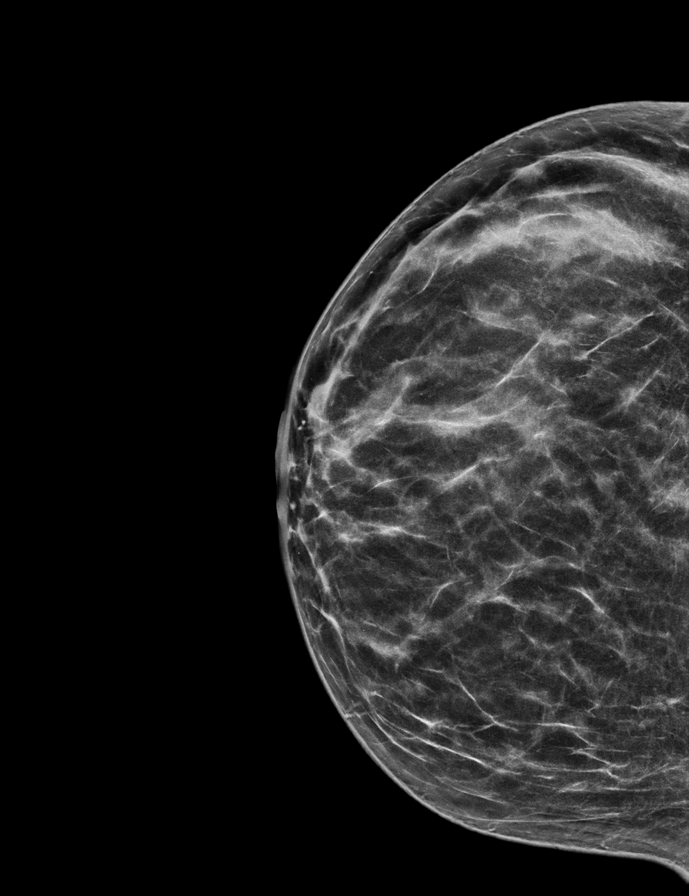

[R MLO synth-2D]
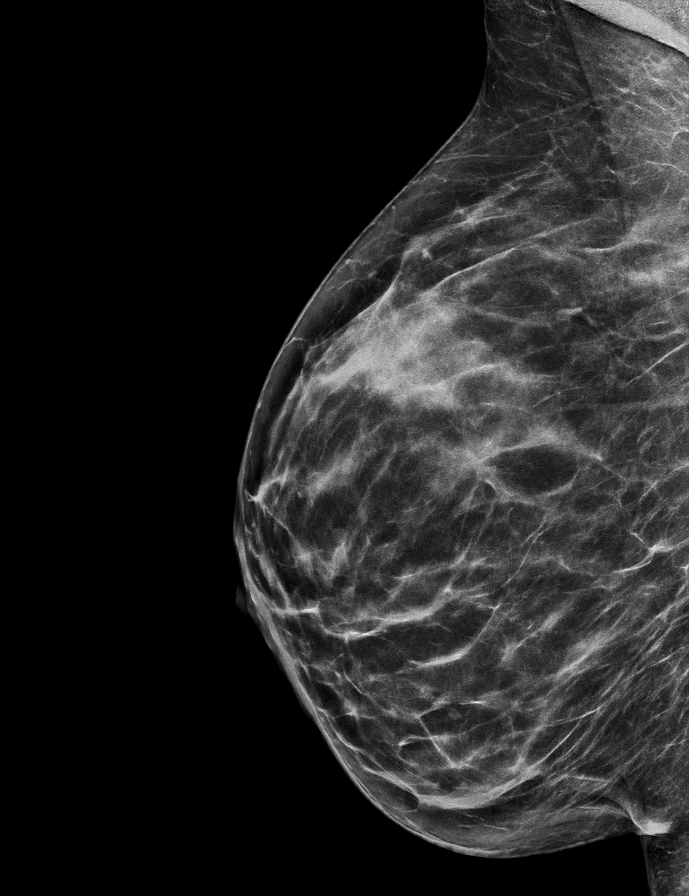

[L MLO tomo · 2 of 57 frames shown]
[frame 19/57]
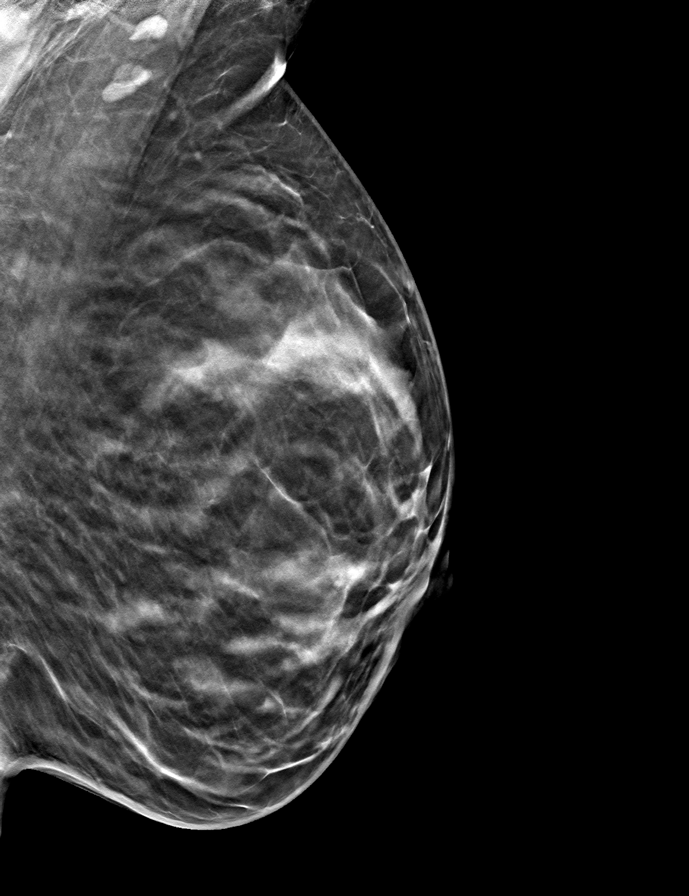
[frame 29/57]
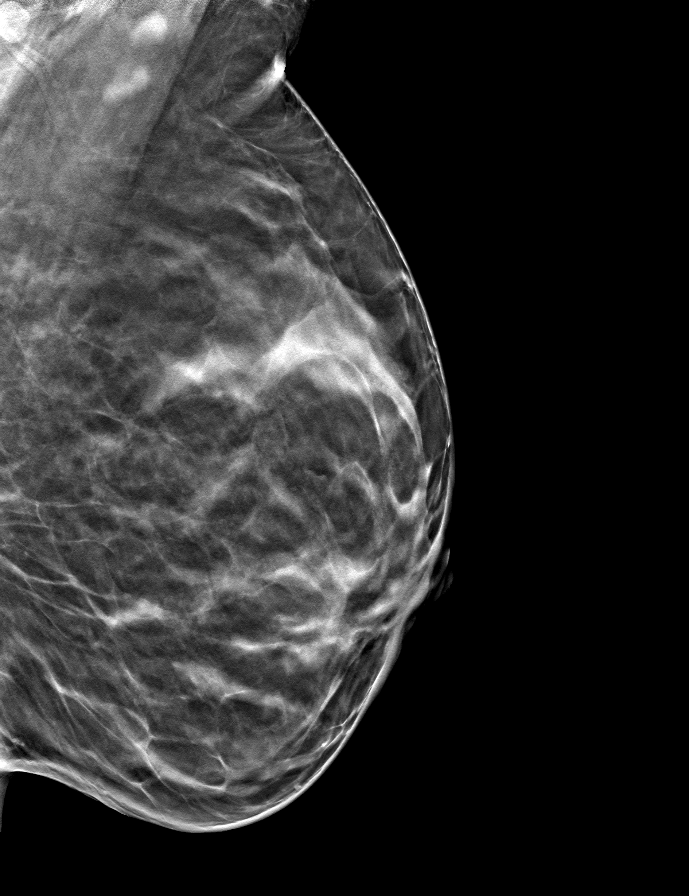

[R CC tomo · tomo slice 29/58.0]
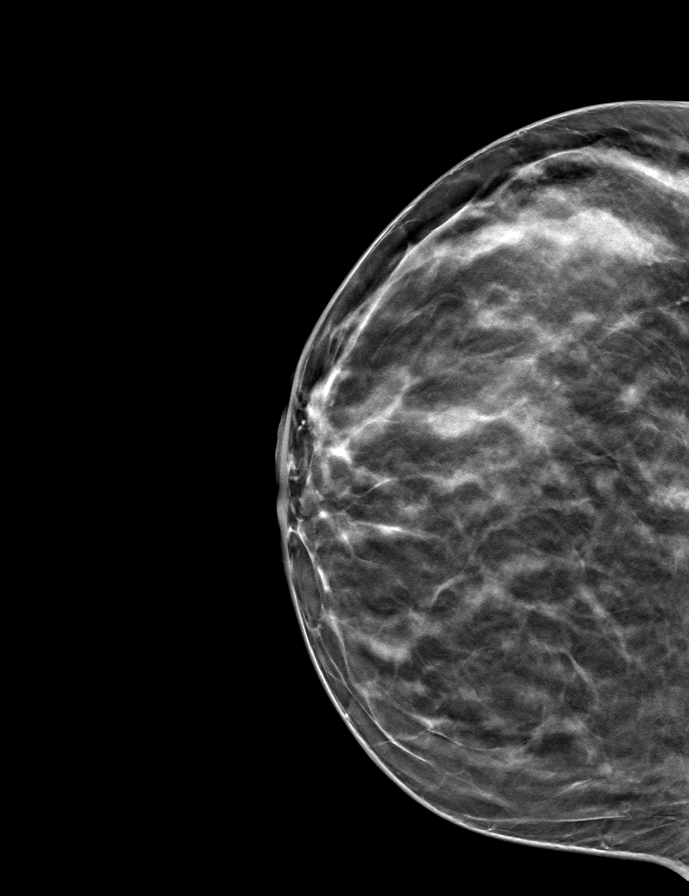

[R MLO tomo · tomo slice 31/61.0]
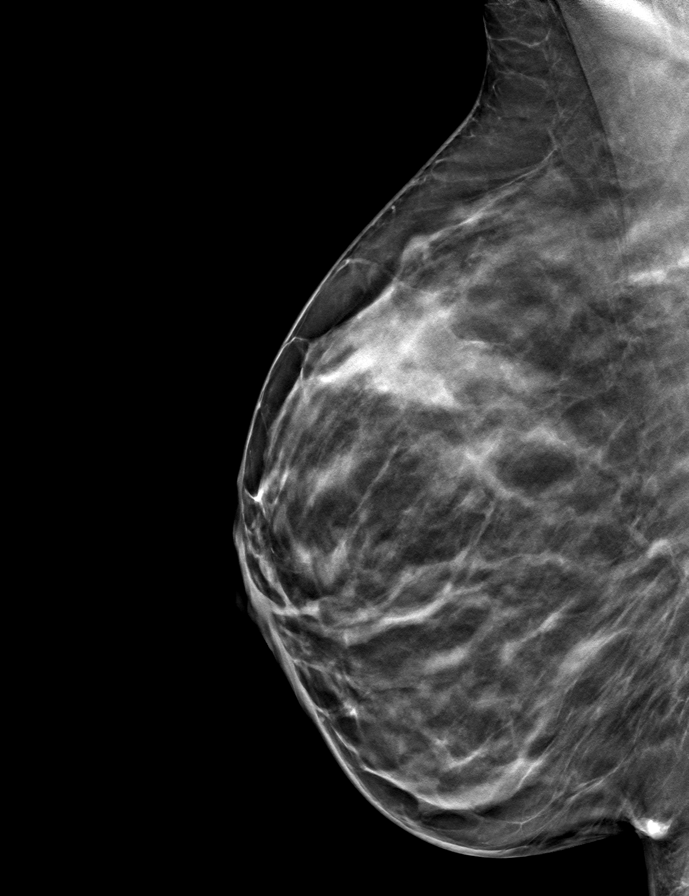

[L CC tomo · tomo slice 30/59.0]
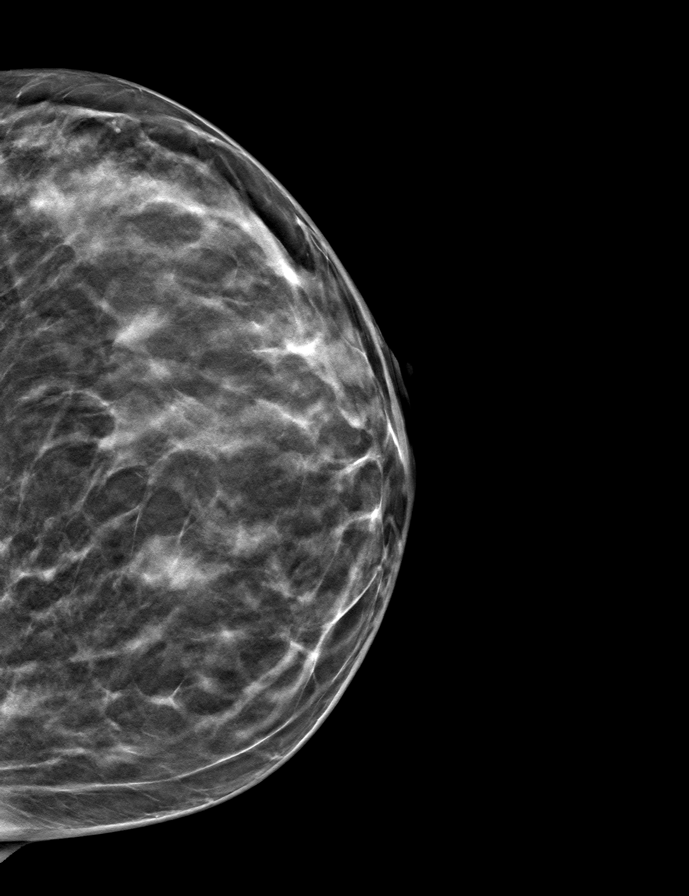

[9 of 24 positions shown; findings below may reference images not displayed]

ACR Breast Density Category c: The breast tissue is heterogeneously
dense, which may obscure small masses.
FINDINGS: There are no findings suspicious for malignancy. Images were
processed with CAD.
IMPRESSION: No mammographic evidence of malignancy. A result letter of this
screening mammogram will be mailed directly to the patient.

RECOMMENDATION:
Screening mammogram in one year. (Code:[id number]-U-LHB)

BI-RADS CATEGORY  1: Negative.

## 2020-11-27 ENCOUNTER — Encounter: Payer: Commercial Managed Care - PPO | Admitting: Family Medicine

## 2020-12-02 ENCOUNTER — Encounter: Payer: Commercial Managed Care - PPO | Admitting: Nurse Practitioner
